# Patient Record
Sex: Male | Born: 1985
Health system: Southern US, Community
[De-identification: ages and names within clinical notes are randomized; demographics above are authoritative.]

## PROBLEM LIST (undated history)

## (undated) HISTORY — PX: HAND SURGERY: SHX662

---
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
## 2006-08-02 ENCOUNTER — Emergency Department (HOSPITAL_COMMUNITY): Admission: EM | Admit: 2006-08-02 | Discharge: 2006-08-02 | Payer: Self-pay | Admitting: Emergency Medicine

## 2006-10-10 ENCOUNTER — Emergency Department (HOSPITAL_COMMUNITY): Admission: EM | Admit: 2006-10-10 | Discharge: 2006-10-10 | Payer: Self-pay | Admitting: Emergency Medicine

## 2010-08-03 ENCOUNTER — Emergency Department (HOSPITAL_COMMUNITY): Admission: EM | Admit: 2010-08-03 | Discharge: 2010-08-03 | Payer: Self-pay | Admitting: Emergency Medicine

## 2010-09-28 ENCOUNTER — Emergency Department (HOSPITAL_COMMUNITY): Admission: EM | Admit: 2010-09-28 | Discharge: 2010-09-28 | Payer: Self-pay | Admitting: Emergency Medicine

## 2011-02-08 LAB — STREP A DNA PROBE: Group A Strep Probe: NEGATIVE

## 2012-03-09 ENCOUNTER — Encounter (HOSPITAL_COMMUNITY): Payer: Self-pay | Admitting: *Deleted

## 2012-03-09 ENCOUNTER — Emergency Department (HOSPITAL_COMMUNITY)
Admission: EM | Admit: 2012-03-09 | Discharge: 2012-03-09 | Disposition: A | Discharge status: Home / Self Care | Payer: Self-pay | Attending: Emergency Medicine | Admitting: Emergency Medicine

## 2012-03-09 DIAGNOSIS — J039 Acute tonsillitis, unspecified: Secondary | ICD-10-CM | POA: Insufficient documentation

## 2012-03-09 DIAGNOSIS — K089 Disorder of teeth and supporting structures, unspecified: Secondary | ICD-10-CM | POA: Insufficient documentation

## 2012-03-09 DIAGNOSIS — F17200 Nicotine dependence, unspecified, uncomplicated: Secondary | ICD-10-CM | POA: Insufficient documentation

## 2012-03-09 DIAGNOSIS — R509 Fever, unspecified: Secondary | ICD-10-CM | POA: Insufficient documentation

## 2012-03-09 DIAGNOSIS — K029 Dental caries, unspecified: Secondary | ICD-10-CM | POA: Insufficient documentation

## 2012-03-09 MED ORDER — PENICILLIN G BENZATHINE 1200000 UNIT/2ML IM SUSP
1.2000 10*6.[IU] | Freq: Once | INTRAMUSCULAR | Status: AC
  Administered 2012-03-09: 1.2 10*6.[IU] via INTRAMUSCULAR
  Filled 2012-03-09: qty 2

## 2012-03-09 MED ORDER — HYDROCODONE-ACETAMINOPHEN 7.5-500 MG/15ML PO SOLN: 15.0000 mL | Freq: Four times a day (QID) | ORAL | Status: AC | PRN

## 2012-03-09 MED ORDER — HYDROCODONE-ACETAMINOPHEN 7.5-500 MG/15ML PO SOLN
15.0000 mL | Freq: Once | ORAL | Status: AC
  Administered 2012-03-09: 15 mL via ORAL
  Filled 2012-03-09: qty 15

## 2012-03-09 MED ORDER — ACETAMINOPHEN 325 MG PO TABS
650.0000 mg | ORAL_TABLET | Freq: Once | ORAL | Status: AC
Start: 2012-03-09 — End: 2012-03-09
  Administered 2012-03-09: 650 mg via ORAL
  Filled 2012-03-09: qty 2

## 2012-03-09 NOTE — ED Provider Notes (Signed)
History     CSN: [id number]  Arrival date & time 03/09/12  0409   First MD Initiated Contact with Patient 03/09/12 [phone]      Chief Complaint  Patient presents with  . Dental Pain    (Consider location/radiation/quality/duration/timing/severity/associated sxs/prior treatment) Patient is a 26 y.o. male presenting with tooth pain. The history is provided by the patient.  Dental PainThe primary symptoms include fever and sore throat. Primary symptoms comment: He also complains of toothache, also starting last night. The symptoms began 6 to 12 hours ago. The symptoms are worsening. The symptoms occur constantly.  The sore throat began today. The sore throat is moderate in intensity. The sore throat is accompanied by hoarse voice. The sore throat is not accompanied by trouble swallowing.  Additional symptoms include: pain with swallowing. Additional symptoms do not include: trouble swallowing.    History reviewed. No pertinent past medical history.  History reviewed. No pertinent past surgical history.  No family history on file.  History  Substance Use Topics  . Smoking status: Current Everyday Smoker  . Smokeless tobacco: Not on file  . Alcohol Use: Yes      Review of Systems  Constitutional: Positive for fever. Negative for chills.  HENT: Positive for sore throat, hoarse voice and dental problem. Negative for trouble swallowing.   Respiratory: Negative.   Cardiovascular: Negative.   Gastrointestinal: Negative.   Musculoskeletal: Negative.   Skin: Negative.   Neurological: Negative.     Allergies  Review of patient's allergies indicates no known allergies.  Home Medications  No current outpatient prescriptions on file.  BP 123/62  Pulse 90  Temp(Src) 101.5 F (38.6 C) (Oral)  Resp 20  SpO2 97%  Physical Exam  Constitutional: He is oriented to person, place, and time. He appears well-developed and well-nourished.  HENT:  Head: No trismus in the jaw.  Right  Ear: Tympanic membrane normal.  Left Ear: Tympanic membrane normal.  Mouth/Throat: Mucous membranes are not dry. Dental caries present. Oropharyngeal exudate and posterior oropharyngeal erythema present. No tonsillar abscesses.  Neck: Normal range of motion. Neck supple.  Cardiovascular: Normal rate and regular rhythm.   No murmur heard. Pulmonary/Chest: Effort normal. He has no wheezes. He has no rales.  Musculoskeletal: Normal range of motion.  Lymphadenopathy:    He has cervical adenopathy.  Neurological: He is alert and oriented to person, place, and time.  Skin: Skin is warm and dry.  Psychiatric: He has a normal mood and affect.    ED Course  Procedures (including critical care time)  Labs Reviewed - No data to display No results found.   No diagnosis found.  1. Exudative tonsillitis 2. Dental caries  MDM  Tonsillar exudate with history of multiple episodes of strep. Will treat without swab - given Bicillin and pain medication.         Rodena Medin, PA-C 03/09/12 [phone]

## 2012-03-09 NOTE — ED Notes (Signed)
Explained to pt that provider should be around to see him soon.

## 2012-03-09 NOTE — Discharge Instructions (Signed)
RECOMMEND WARM, SALT WATER GARGLES FOR SORE THROAT PAIN. TAKE LORTAB ELIXIR FOR PAIN RELIEF AS WELL. PUSH FLUIDS. RETURN HERE WITH ANY WORSENING SYMPTOMS OR NEW CONCERNS.   Tonsillitis Tonsils are lumps of lymphoid tissues at the back of the throat. Each tonsil has 20 crevices (crypts). Tonsils help fight nose and throat infections and keep infection from spreading to other parts of the body for the first 18 months of life. Tonsillitis is an infection of the throat that causes the tonsils to become red, tender, and swollen. CAUSES Sudden and, if treated, temporary (acute) tonsillitis is usually caused by infection with streptococcal bacteria. Long lasting (chronic) tonsillitis occurs when the crypts of the tonsils become filled with pieces of food and bacteria, which makes it easy for the tonsils to become constantly infected. SYMPTOMS  Symptoms of tonsillitis include:  A sore throat.   White patches on the tonsils.   Fever.   Tiredness.  DIAGNOSIS Tonsillitis can be diagnosed through a physical exam. Diagnosis can be confirmed with the results of lab tests, including a throat culture. TREATMENT  The goals of tonsillitis treatment include the reduction of the severity and duration of symptoms, prevention of associated conditions, and prevention of disease transmission. Tonsillitis caused by bacteria can be treated with antibiotics. Usually, treatment with antibiotics is started before the cause of the tonsillitis is known. However, if it is determined that the cause is not bacterial, antibiotics will not treat the tonsillitis. If attacks of tonsillitis are severe and frequent, your caregiver may recommend surgery to remove the tonsils (tonsillectomy). HOME CARE INSTRUCTIONS   Rest as much as possible and get plenty of sleep.   Drink plenty of fluids. While the throat is very sore, eat soft foods or liquids, such as sherbet, soups, or instant breakfast drinks.   Eat frozen ice pops.    Older children and adults may gargle with a warm or cold liquid to help soothe the throat. Mix 1 teaspoon of salt in 1 cup of water.   Other family members who also develop a sore throat or fever should have a medical exam or throat culture.   Only take over-the-counter or prescription medicines for pain, discomfort, or fever as directed by your caregiver.   If you are given antibiotics, take them as directed. Finish them even if you start to feel better.  SEEK MEDICAL CARE IF:   Your baby is older than 3 months with a rectal temperature of 100.5 F (38.1 C) or higher for more than 1 day.   Large, tender lumps develop in your neck.   A rash develops.   Green, yellow-brown, or bloody substance is coughed up.   You are unable to swallow liquids or food for 24 hours.   Your child is unable to swallow food or liquids for 12 hours.  SEEK IMMEDIATE MEDICAL CARE IF:   You develop any new symptoms such as vomiting, severe headache, stiff neck, chest pain, or trouble breathing or swallowing.   You have severe throat pain along with drooling or voice changes.   You have severe pain, unrelieved with recommended medications.   You are unable to fully open the mouth.   You develop redness, swelling, or severe pain anywhere in the neck.   You have a fever.   Your baby is older than 3 months with a rectal temperature of 102 F (38.9 C) or higher.   Your baby is 46 months old or younger with a rectal temperature of 100.4 F (  38 C) or higher.  MAKE SURE YOU:   Understand these instructions.   Will watch your condition.   Will get help right away if you are not doing well or get worse.  Document Released: 08/22/2005 Document Revised: 11/01/2011 Document Reviewed: 01/18/2011 [facility] Patient Information 2012 [place], [place].

## 2012-03-09 NOTE — ED Notes (Signed)
The pt says he has a sorethroat or a toothache that started today

## 2012-03-09 NOTE — ED Provider Notes (Signed)
Medical screening examination/treatment/procedure(s) were performed by non-physician practitioner and as supervising physician I was immediately available for consultation/collaboration.  Olivia Mackie, MD 03/09/12 [phone]

## 2014-03-09 ENCOUNTER — Emergency Department (HOSPITAL_COMMUNITY)
Admission: EM | Admit: 2014-03-09 | Discharge: 2014-03-09 | Disposition: A | Discharge status: Home / Self Care | Payer: Self-pay | Attending: Emergency Medicine | Admitting: Emergency Medicine

## 2014-03-09 DIAGNOSIS — F17200 Nicotine dependence, unspecified, uncomplicated: Secondary | ICD-10-CM | POA: Insufficient documentation

## 2014-03-09 DIAGNOSIS — K029 Dental caries, unspecified: Secondary | ICD-10-CM | POA: Insufficient documentation

## 2014-03-09 MED ORDER — OXYCODONE-ACETAMINOPHEN 5-325 MG PO TABS: 1.0000 | ORAL_TABLET | Freq: Once | ORAL | Status: DC

## 2014-03-09 MED ORDER — IBUPROFEN 600 MG PO TABS: 600.0000 mg | ORAL_TABLET | Freq: Four times a day (QID) | ORAL | Status: DC | PRN

## 2014-03-09 MED ORDER — OXYCODONE-ACETAMINOPHEN 5-325 MG PO TABS
1.0000 | ORAL_TABLET | Freq: Once | ORAL | Status: AC
  Administered 2014-03-09: 1 via ORAL
  Filled 2014-03-09: qty 1

## 2014-03-09 MED ORDER — PENICILLIN V POTASSIUM 500 MG PO TABS: 500.0000 mg | ORAL_TABLET | Freq: Four times a day (QID) | ORAL | Status: DC

## 2014-03-09 MED ORDER — OXYCODONE-ACETAMINOPHEN 5-325 MG PO TABS: 1.0000 | ORAL_TABLET | Freq: Four times a day (QID) | ORAL | Status: DC | PRN

## 2014-03-09 NOTE — Progress Notes (Signed)
   CARE MANAGEMENT ED NOTE 03/09/2014  Patient:  Joshua Small, Joshua Small   Account Number:  [id number]  Date Initiated:  03/09/2014  Documentation initiated by:  Livia Snellen  Subjective/Objective Assessment:   Patient presents to Ed with dental pain.     Subjective/Objective Assessment Detail:     Action/Plan:   Action/Plan Detail:   Anticipated DC Date:  03/09/2014     Status Recommendation to Physician:   Result of Recommendation:    Other ED [place]  Other  PCP issues    Choice offered to / List presented to:            Status of service:  Completed, signed off  ED Comments:   ED Comments Detail:  EDCM spoke to patient at bedside.  As per patient, he does not have a pcp or insurance living in [place] [employer] Patient to be discharged on pen v k, which is free at [facility], [facility] informed patient of this.  [facility] provided patient with a list of pcps who accept self pay patients, list of discounted pharmcies and websites needymeds.org and Good https://www.henry-hernandez.biz/ for medication assistance, phone number to inquire bout the orange card, Medicaid and Affordable Care Act for insurance, list of financial resources in the community such as local churches and [employer], and dental assistance for uninsured patients.  Also provided patient with RX discount card. Patient thankful for resources.  No further EDCM need at this time.

## 2014-03-09 NOTE — ED Provider Notes (Signed)
CSN: [id number]     Arrival date & time 03/09/14  1710 History  This chart was scribed for non-physician practitioner .working with Ephraim Hamburger, MD by Mercy Moore, ED Scribe. This patient was seen in room WTR8/WTR8 and the patient's care was started at 6:22 PM.   Chief Complaint  Patient presents with  . Dental Pain      The history is provided by the patient. No language interpreter was used.   HPI Comments: Joshua Small is a 28 y.o. male who presents to the Emergency Department complaining of lower right sided dental pain after breaking a tooth while eating hard candy earlier today. Patient admits to having problems with the tooth previously. He says there was already a caries there that has grown in size as result of his breaking it. Patient suspects that the tooth may be infected, but is uncertain. Patient has treated his dental pain with Tylenol which is not helping. Pain is constant, worsened with chewing. No swelling under the tongue. No difficulty opening mouth. Patient does not have dentist.  No past medical history on file. No past surgical history on file. No family history on file. History  Substance Use Topics  . Smoking status: Current Every Day Smoker  . Smokeless tobacco: Not on file  . Alcohol Use: Yes    Review of Systems  Constitutional: Negative for fever.  HENT: Positive for dental problem.       Allergies  Review of patient's allergies indicates no known allergies.  Home Medications   Prior to Admission medications   Not on File   Triage VtBP 127/80  Pulse 59  Temp(Src) 98.5 F (36.9 C) (Oral)  Resp 16  SpO2 99% Physical Exam  Nursing note and vitals reviewed. Constitutional: He is oriented to person, place, and time. He appears well-developed and well-nourished. No distress.  HENT:  Head: Normocephalic and atraumatic.  Mouth/Throat: Dental caries present. No dental abscesses.  Large carries in right lower 1st molar. No surrounding  gum swelling, no erythema. Tooth tender to palpation. No trismus. No swelling under the tongue. No facial sweklling.   Eyes: EOM are normal.  Neck: Neck supple. No tracheal deviation present.  Cardiovascular: Normal rate.   Pulmonary/Chest: Effort normal. No respiratory distress.  Musculoskeletal: Normal range of motion.  Neurological: He is alert and oriented to person, place, and time.  Skin: Skin is warm and dry.  Psychiatric: He has a normal mood and affect. His behavior is normal.    ED Course  Procedures (including critical care time) DIAGNOSTIC STUDIES: Oxygen Saturation is 99% on room air, normal by my interpretation.    COORDINATION OF CARE: 6:25 PM- Will order pain medication, an antibiotic and refer to on-call dentist. Discussed treatment plan with patient at bedside and patient agreed to plan.     Labs Review Labs Reviewed - No data to display  Imaging Review No results found.   EKG Interpretation None      MDM   Final diagnoses:  Dental cavity    Patient with a large dental caries, no signs of an abscess or infection at this time. No evidence of Ludwig's angina, no swelling under the tongue, no trismus. Patient does not have a dentist. Will start on penicillin, ibuprofen and Percocet for pain. Will refer to a dentist and given a flyer for free dental clinic at this time. Patient instructed to followup.  Filed Vitals:   03/09/14 1721 03/09/14 1742  BP: 126/70 127/80  Pulse: 60  59  Temp: 98.1 F (36.7 C) 98.5 F (36.9 C)  TempSrc: Oral Oral  Resp: 16 16  SpO2: 97% 99%   I personally performed the services described in this documentation, which was scribed in my presence. The recorded information has been reviewed and is accurate.   Renold Genta, PA-C 03/09/14 1945

## 2014-03-09 NOTE — ED Notes (Signed)
Pt has a ride home.  

## 2014-03-09 NOTE — ED Notes (Signed)
Pt states a tooth broke off in his bottom molar area in the R side of his mouth. Pt states it happened today. Pt does not have a dentist. Pt alert, no acute distress.

## 2014-03-09 NOTE — Discharge Instructions (Signed)
Ibuprofen for pain. Oxycodone for severe pain. Penicillin for infection. Follow up with a dentist.    Dental Caries  Dental caries (also called tooth decay) is the most common oral disease. It can occur at any age, but is more common in children and young adults.  HOW DENTAL CARIES DEVELOPS  The process of decay begins when bacteria and foods (particularly sugars and starches) combine in your mouth to produce plaque. Plaque is a substance that sticks to the hard, outer surface of a tooth (enamel). The bacteria in plaque produce acids that attack enamel. These acids may also attack the root surface of a tooth (cementum) if it is exposed. Repeated attacks dissolve these surfaces and create holes in the tooth (cavities). If left untreated, the acids destroy the other layers of the tooth.  RISK FACTORS  Frequent sipping of sugary beverages.   Frequent snacking on sugary and starchy foods, especially those that easily get stuck in the teeth.   Poor oral hygiene.   Dry mouth.   Substance abuse such as methamphetamine abuse.   Broken or poor-fitting dental restorations.   Eating disorders.   Gastroesophageal reflux disease (GERD).   Certain radiation treatments to the head and neck. SYMPTOMS In the early stages of dental caries, symptoms are seldom present. Sometimes white, chalky areas may be seen on the enamel or other tooth layers. In later stages, symptoms may include:  Pits and holes on the enamel.  Toothache after sweet, hot, or cold foods or drinks are consumed.  Pain around the tooth.  Swelling around the tooth. DIAGNOSIS  Most of the time, dental caries is detected during a regular dental checkup. A diagnosis is made after a thorough medical and dental history is taken and the surfaces of your teeth are checked for signs of dental caries. Sometimes special instruments, such as lasers, are used to check for dental caries. Dental X-ray exams may be taken so that areas not  visible to the eye (such as between the contact areas of the teeth) can be checked for cavities.  TREATMENT  If dental caries is in its early stages, it may be reversed with a fluoride treatment or an application of a remineralizing agent at the dental office. Thorough brushing and flossing at home is needed to aid these treatments. If it is in its later stages, treatment depends on the location and extent of tooth destruction:   If a small area of the tooth has been destroyed, the destroyed area will be removed and cavities will be filled with a material such as gold, silver amalgam, or composite resin.   If a large area of the tooth has been destroyed, the destroyed area will be removed and a cap (crown) will be fitted over the remaining tooth structure.   If the center part of the tooth (pulp) is affected, a procedure called a root canal will be needed before a filling or crown can be placed.   If most of the tooth has been destroyed, the tooth may need to be pulled (extracted). HOME CARE INSTRUCTIONS You can prevent, stop, or reverse dental caries at home by practicing good oral hygiene. Good oral hygiene includes:  Thoroughly cleaning your teeth at least twice a day with a toothbrush and dental floss.   Using a fluoride toothpaste. A fluoride mouth rinse may also be used if recommended by your dentist or health care provider.   Restricting the amount of sugary and starchy foods and sugary liquids you consume.  Avoiding frequent snacking on these foods and sipping of these liquids.   Keeping regular visits with a dentist for checkups and cleanings. PREVENTION   Practice good oral hygiene.  Consider a dental sealant. A dental sealant is a coating material that is applied by your dentist to the pits and grooves of teeth. The sealant prevents food from being trapped in them. It may protect the teeth for several years.  Ask about fluoride supplements if you live in a community  without fluorinated water or with water that has a low fluoride content. Use fluoride supplements as directed by your dentist or health care provider.  Allow fluoride varnish applications to teeth if directed by your dentist or health care provider. Document Released: 08/04/2002 Document Revised: 07/15/2013 Document Reviewed: 11/14/2012 [facility] Patient Information 2014 [place].

## 2014-03-11 NOTE — ED Provider Notes (Signed)
Medical screening examination/treatment/procedure(s) were performed by non-physician practitioner and as supervising physician I was immediately available for consultation/collaboration.   EKG Interpretation None        Elany Felix T Nanette Wirsing, MD 03/11/14 1549 

## 2014-07-13 ENCOUNTER — Emergency Department (HOSPITAL_COMMUNITY)
Admission: EM | Admit: 2014-07-13 | Discharge: 2014-07-13 | Disposition: A | Discharge status: Home / Self Care | Payer: Self-pay | Attending: Emergency Medicine | Admitting: Emergency Medicine

## 2014-07-13 ENCOUNTER — Emergency Department (HOSPITAL_COMMUNITY): Payer: Self-pay

## 2014-07-13 ENCOUNTER — Encounter (HOSPITAL_COMMUNITY): Payer: Self-pay | Admitting: Emergency Medicine

## 2014-07-13 DIAGNOSIS — F17200 Nicotine dependence, unspecified, uncomplicated: Secondary | ICD-10-CM | POA: Insufficient documentation

## 2014-07-13 DIAGNOSIS — Z79899 Other long term (current) drug therapy: Secondary | ICD-10-CM | POA: Insufficient documentation

## 2014-07-13 DIAGNOSIS — J029 Acute pharyngitis, unspecified: Secondary | ICD-10-CM | POA: Insufficient documentation

## 2014-07-13 DIAGNOSIS — J069 Acute upper respiratory infection, unspecified: Secondary | ICD-10-CM | POA: Insufficient documentation

## 2014-07-13 LAB — RAPID STREP SCREEN (MED CTR MEBANE ONLY): Streptococcus, Group A Screen (Direct): NEGATIVE

## 2014-07-13 MED ORDER — ACETAMINOPHEN-CODEINE 120-12 MG/5ML PO SOLN: 10.0000 mL | ORAL | Status: DC | PRN

## 2014-07-13 MED ORDER — PREDNISONE 50 MG PO TABS: 50.0000 mg | ORAL_TABLET | Freq: Every day | ORAL | Status: DC

## 2014-07-13 MED ORDER — GUAIFENESIN ER 1200 MG PO TB12: 1.0000 | ORAL_TABLET | Freq: Two times a day (BID) | ORAL | Status: DC

## 2014-07-13 NOTE — ED Notes (Signed)
Pt c/o sore throat and congestion x 1 day. Pt has productive cough of clear sputum.

## 2014-07-13 NOTE — ED Notes (Signed)
Patient is alert and oriented x3.  He was given DC instructions and follow up visit instructions.  Patient gave verbal understanding.  He was DC ambulatory under his own power to home.  V/S stable.  He was not showing any signs of distress on DC 

## 2014-07-13 NOTE — Discharge Instructions (Signed)
Return here as needed. Follow up with a primary doctor or urgent care. Increase your fluid intake. Rest as much as possible. Your strep screen was negative and your x-rays were normal

## 2014-07-13 NOTE — ED Provider Notes (Signed)
CSN: [id number]     Arrival date & time 07/13/14  1843 History   None    This chart was scribed for non-physician practitioner, Irena Cords PA-C working with Evelina Bucy, MD by Forrestine Him, ED Scribe. This patient was seen in room WTR6/WTR6 and the patient's care was started at 8:08 PM.   Chief Complaint  Patient presents with  . Sore Throat  . congestion    The history is provided by the patient. No language interpreter was used.    HPI Comments: Joshua Small is a 28 y.o. male who presents to the Emergency Department complaining of a constant, moderate sore throat x 1 day that is unchanged at this time. Pt also reports congestion and a productive cough consisting of clear mucous. He has not tried any OTC medications or any home remedies to help manage symptoms. At this time he denies any fever, chills, CP, rhinorrhea, or neck pain. Pt is an every day smoker and smokes about 1 pack of cigarettes a day. Pt is unaware of any known sick contacts.  History reviewed. No pertinent past medical history. History reviewed. No pertinent past surgical history. No family history on file. History  Substance Use Topics  . Smoking status: Current Every Day Smoker  . Smokeless tobacco: Not on file  . Alcohol Use: Yes    Review of Systems  Constitutional: Negative for fever and chills.  HENT: Positive for congestion and sore throat. Negative for rhinorrhea.   Respiratory: Positive for cough.   Psychiatric/Behavioral: Negative for confusion.      Allergies  Review of patient's allergies indicates no known allergies.  Home Medications   Prior to Admission medications   Medication Sig Start Date End Date Taking? Authorizing Provider  ibuprofen (ADVIL,MOTRIN) 600 MG tablet Take 1 tablet (600 mg total) by mouth every 6 (six) hours as needed. 03/09/14   Tatyana A Kirichenko, PA-C  oxyCODONE-acetaminophen (PERCOCET/ROXICET) 5-325 MG per tablet Take 1 tablet by mouth every 6 (six) hours as  needed for severe pain. 03/09/14   Tatyana A Kirichenko, PA-C  penicillin v potassium (VEETID) 500 MG tablet Take 1 tablet (500 mg total) by mouth 4 (four) times daily. 03/09/14   Tatyana A Kirichenko, PA-C   Triage Vitals: BP 138/69  Pulse 62  Temp(Src) 98.9 F (37.2 C) (Oral)  Resp 20  SpO2 97%   Physical Exam  Nursing note and vitals reviewed. Constitutional: He is oriented to person, place, and time. He appears well-developed and well-nourished.  HENT:  Head: Normocephalic and atraumatic.  Mouth/Throat: Oropharynx is clear and moist.  Eyes: Pupils are equal, round, and reactive to light.  Neck: Normal range of motion. Neck supple.  Cardiovascular: Normal rate, regular rhythm and normal heart sounds.  Exam reveals no gallop and no friction rub.   No murmur heard. Pulmonary/Chest: Effort normal and breath sounds normal. No respiratory distress. He has no wheezes.  Lymphadenopathy:    He has cervical adenopathy.  Neurological: He is alert and oriented to person, place, and time.  Skin: Skin is warm and dry. No rash noted. No erythema.  Psychiatric: He has a normal mood and affect.    ED Course  Procedures (including critical care time)  DIAGNOSTIC STUDIES: Oxygen Saturation is 97% on RA, Normal by my interpretation.    COORDINATION OF CARE: 8:07 PM- Will order DG chest 2 view and rapid strep screen. Discussed treatment plan with pt at bedside and pt agreed to plan.     Labs Review  Labs Reviewed  RAPID STREP SCREEN  CULTURE, GROUP A STREP   I personally performed the services described in this documentation, which was scribed in my presence. The recorded information has been reviewed and is accurate.    Brent General, PA-C 07/13/14 2051

## 2014-07-14 NOTE — ED Provider Notes (Signed)
Medical screening examination/treatment/procedure(s) were performed by non-physician practitioner and as supervising physician I was immediately available for consultation/collaboration.   EKG Interpretation None        Evelina Bucy, MD 07/14/14 0003

## 2014-07-15 LAB — CULTURE, GROUP A STREP

## 2014-09-16 ENCOUNTER — Encounter (HOSPITAL_COMMUNITY): Payer: Self-pay | Admitting: Emergency Medicine

## 2014-09-16 ENCOUNTER — Emergency Department (HOSPITAL_COMMUNITY)
Admission: EM | Admit: 2014-09-16 | Discharge: 2014-09-17 | Disposition: A | Discharge status: Home / Self Care | Payer: Self-pay | Attending: Emergency Medicine | Admitting: Emergency Medicine

## 2014-09-16 DIAGNOSIS — K088 Other specified disorders of teeth and supporting structures: Secondary | ICD-10-CM | POA: Insufficient documentation

## 2014-09-16 DIAGNOSIS — K0889 Other specified disorders of teeth and supporting structures: Secondary | ICD-10-CM

## 2014-09-16 DIAGNOSIS — K029 Dental caries, unspecified: Secondary | ICD-10-CM | POA: Insufficient documentation

## 2014-09-16 DIAGNOSIS — Z720 Tobacco use: Secondary | ICD-10-CM | POA: Insufficient documentation

## 2014-09-16 NOTE — ED Notes (Signed)
Pt is c/o toothache on the bottom right  Sxs started a day ago

## 2014-09-17 MED ORDER — OXYCODONE-ACETAMINOPHEN 5-325 MG PO TABS: 1.0000 | ORAL_TABLET | Freq: Three times a day (TID) | ORAL | Status: DC | PRN

## 2014-09-17 MED ORDER — PENICILLIN V POTASSIUM 250 MG PO TABS: 500.0000 mg | ORAL_TABLET | Freq: Four times a day (QID) | ORAL | Status: AC

## 2014-09-17 MED ORDER — IBUPROFEN 400 MG PO TABS: 400.0000 mg | ORAL_TABLET | Freq: Four times a day (QID) | ORAL | Status: DC | PRN

## 2014-09-17 MED ORDER — OXYCODONE-ACETAMINOPHEN 5-325 MG PO TABS
1.0000 | ORAL_TABLET | Freq: Once | ORAL | Status: AC
  Administered 2014-09-17: 1 via ORAL
  Filled 2014-09-17: qty 1

## 2014-09-17 NOTE — ED Provider Notes (Signed)
Medical screening examination/treatment/procedure(s) were performed by non-physician practitioner and as supervising physician I was immediately available for consultation/collaboration.   Delora Fuel, MD [phone]

## 2014-09-17 NOTE — Discharge Instructions (Signed)
Please call your doctor for a followup appointment within 24-48 hours. When you talk to your doctor please let them know that you were seen in the emergency department and have them acquire all of your records so that they can discuss the findings with you and formulate a treatment plan to fully care for your new and ongoing problems. Please call and set-up an appointment with a dentist Please rest and stay hydrated Please drink plenty of water Please take medications on a full stomach  Please take medications as prescribed - while on pain medications there is to be no drinking alcohol, driving, operating any heavy machinery. If extra please dispose in a proper manner. Please do not take any extra Tylenol with this medication for this can lead to Tylenol overdose and liver issues.  Please continue to monitor symptoms closely and if symptoms are to worsen or change (fever greater than 101, chills, sweating, nausea, vomiting, chest pain, shortness of breathe, difficulty breathing, weakness, numbness, tingling, worsening or changes to pain pattern, swelling to the face, drainage, bleeding, inability to open and close the mouth) please report back to the Emergency Department immediately.    Dental Pain A tooth ache may be caused by cavities (tooth decay). Cavities expose the nerve of the tooth to air and hot or cold temperatures. It may come from an infection or abscess (also called a boil or furuncle) around your tooth. It is also often caused by dental caries (tooth decay). This causes the pain you are having. DIAGNOSIS  Your caregiver can diagnose this problem by exam. TREATMENT   If caused by an infection, it may be treated with medications which kill germs (antibiotics) and pain medications as prescribed by your caregiver. Take medications as directed.  Only take over-the-counter or prescription medicines for pain, discomfort, or fever as directed by your caregiver.  Whether the tooth ache today  is caused by infection or dental disease, you should see your dentist as soon as possible for further care. SEEK MEDICAL CARE IF: The exam and treatment you received today has been provided on an emergency basis only. This is not a substitute for complete medical or dental care. If your problem worsens or new problems (symptoms) appear, and you are unable to meet with your dentist, call or return to this location. SEEK IMMEDIATE MEDICAL CARE IF:   You have a fever.  You develop redness and swelling of your face, jaw, or neck.  You are unable to open your mouth.  You have severe pain uncontrolled by pain medicine. MAKE SURE YOU:   Understand these instructions.  Will watch your condition.  Will get help right away if you are not doing well or get worse. Document Released: 11/12/2005 Document Revised: 02/04/2012 Document Reviewed: 06/30/2008 [facility] Patient Information 2015 [place], [place]. This information is not intended to replace advice given to you by your health care provider. Make sure you discuss any questions you have with your health care provider.  Dental Caries Dental caries is tooth decay. This decay can cause a hole in teeth (cavity) that can get bigger and deeper over time. HOME CARE  Brush and floss your teeth. Do this at least two times a day.  Use a fluoride toothpaste.  Use a mouth rinse if told by your dentist or doctor.  Eat less sugary and starchy foods. Drink less sugary drinks.  Avoid snacking often on sugary and starchy foods. Avoid sipping often on sugary drinks.  Keep regular checkups and cleanings with  your dentist.  Use fluoride supplements if told by your dentist or doctor.  Allow fluoride to be applied to teeth if told by your dentist or doctor. Document Released: 08/21/2008 Document Revised: 03/29/2014 Document Reviewed: 11/14/2012 [facility] Patient Information 2015 [place], [place]. This information is not intended to replace advice given to you  by your health care provider. Make sure you discuss any questions you have with your health care provider.   Emergency Department Resource Guide 1) Find a Doctor and Pay Out of Pocket Although you won't have to find out who is covered by your insurance plan, it is a good idea to ask around and get recommendations. You will then need to call the office and see if the doctor you have chosen will accept you as a new patient and what types of options they offer for patients who are self-pay. Some doctors offer discounts or will set up payment plans for their patients who do not have insurance, but you will need to ask so you aren't surprised when you get to your appointment.  2) Contact Your Local Health Department Not all health departments have doctors that can see patients for sick visits, but many do, so it is worth a call to see if yours does. If you don't know where your local health department is, you can check in your phone book. The CDC also has a tool to help you locate your state's health department, and many state websites also have listings of all of their local health departments.  3) Find a [facility] If your illness is not likely to be very severe or complicated, you may want to try a walk in clinic. These are popping up all over the country in pharmacies, drugstores, and shopping centers. They're usually staffed by nurse practitioners or physician assistants that have been trained to treat common illnesses and complaints. They're usually fairly quick and inexpensive. However, if you have serious medical issues or chronic medical problems, these are probably not your best option.  No Primary Care Doctor: - Call Health Connect at  [phone] - they can help you locate a primary care doctor that  accepts your insurance, provides certain services, etc. - Physician Referral Service- [phone]  Chronic Pain Problems: Organization         Address  Phone   Notes  [facility]  [phone] Patients need to be referred by their primary care doctor.   Medication Assistance: Organization         Address  Phone   Notes  [facility] Medication Day [facility] [place], [place] [address] [phone] --Must be a resident of [facility] -- Must have NO insurance coverage whatsoever (no Medicaid/ Medicare, etc.) -- The pt. MUST have a primary care doctor that directs their care regularly and follows them in the community   [facility]ist  [phone]   [employer]  [phone]    Agencies that provide inexpensive medical care: Organization         Address  Phone   Notes  [place]  [phone]   [facility]    [phone]   [facility] [place], [place] [address] [phone]   Rosalie [address], [place] [phone]   Planned Parenthood    [phone]   [facility]    [phone]   Community  Health and [place]  [address], [place] Phone:  [phone], Fax:  [phone] Hours of Operation:  9 am - 6 pm, M-F.  Also accepts Medicaid/Medicare and self-pay.  [facility] for [place], [address], [place] Phone: [phone], Fax: [phone]. Hours of Operation:  8:30 am - 5:30 pm, M-F.  Also accepts Medicaid and self-pay.  [facility] [address] [place] Phone: [phone]   [place], [place], [place] [phone], Ext. [phone] Mondays & Thursdays: 7-9 AM.  First 15 patients are seen on a first come, first serve basis.    [place] Providers:  Organization         Address  Phone   Notes  [facility] [address], [place] [phone] Also accepts self-pay patients.  [facility] [address], [place]  [phone]   [place], [address], [place] [phone]   [facility] [address] [place] [phone]   Lucianne Lei [address], [place]   [phone] Only accepts [place] Access [place] patients after they have their name applied to their card.   Self-Pay (no insurance) in [facility]:  Organization         Address  Phone   Notes  Sickle Cell Patients, [facility] [place] [phone]   [facility] [place] [phone]   [facility] [place]  [place], [place], [place] [phone]   [facility]/Dr. Osei-Bonsu  [address], [place] or [address], [place] [phone] Phone number for both [place] and [place] locations is the same.  [facility] [address], [place] [phone]   [facility] [address], [place] or [address] [phone]   [facility] [address] [place] [phone], phone; [phone], fax Sees patients 1st and 3rd Saturday of every month.  Must not qualify for public or private insurance (i.e. Medicaid, Medicare, [place] Health Choice, Veterans' Benefits)  Household income should be no more than 200% of the poverty level The clinic cannot treat you if you are pregnant or think you are pregnant  Sexually transmitted diseases are not treated at the clinic.    Dental Care: Organization         Address  Phone  Notes  [facility] Department of [facility] [phone] Accepts children up to age 13 who are enrolled in [place] or [place]; pregnant women with a Medicaid card; and children who have applied for Medicaid or [place]  Health Choice, but were declined, whose parents can pay a reduced fee at time of service.  [facility] Department of [facility]  [address], [place] [phone] Accepts children up to age 61 who are enrolled in [place] or [place]; pregnant women with a Medicaid card; and children who have applied for Medicaid or [place] Health Choice, but were declined, whose parents can pay a reduced fee at time of service.  [place] [facility] Access PRO[facility]  [phone]  [place], [place] [phone] Patients are seen by appointment only. Walk-ins are not accepted. [place] will see patients 55 years of age and older. Monday - Tuesday (8am-5pm) Most Wednesdays (8:30-5pm) $30 per visit, cash only  [facility] Adult Dental Access PROGRAM  [address], [facility] [phone] Patients are seen by appointment only. Walk-ins are not accepted. [place] will see patients 77 years of age and older. One Wednesday Evening (Monthly: Volunteer Based).  $30 per visit, cash only  [place]  [phone] for adults; Children under age 30, call Graduate Pediatric Dentistry at [phone]. Children aged 44-14, please call [phone] to request a pediatric application.  Dental services are provided in all areas of dental care including fillings, crowns and bridges, complete and partial dentures, implants, gum treatment, root canals, and extractions. Preventive care is also provided. Treatment is provided to both adults and children. Patients are selected via a lottery and there is often a waiting list.   [facility] [address] [place]  [phone] [email or web address]   [facility] [address] [place], [place] [phone], Ext. [phone] Second and Fourth Thursday of each month, opens at 6:30 AM; Clinic ends at 9 AM.  Patients are seen on a first-come first-served basis, and a limited number are seen during  each clinic.   [facility]  [address] Hillard Danker [place], [place] [phone]   Eligibility Requirements You must have lived in [place], [place], or [place] counties for at least the last three months.   You cannot be eligible for state or federal sponsored [employer], including [employer], [place], or [employer].   You generally cannot be eligible for healthcare insurance through your employer.    How to apply: Eligibility screenings are held every Tuesday and Wednesday afternoon from 1:00 pm until 4:00 pm. You do not need an appointment for the interview!  [facility] [address], [place], [place]   [place]  [place] Department  [place]  [phone]    Behavioral Health Resources in the Community: Intensive Outpatient Programs Organization         Address  Phone  Notes  [place]. [address] [place], [place] [phone]   [facility] Outpatient [address], [place], [place]   ADS: Alcohol & Drug Svcs [address] [place], [place]   [place] [address],  [place], [place] or [phone]   Substance Abuse Resources Organization         Address  Phone  Notes  Alcohol and Drug Services  [phone]   [place]  [phone]   [facility] [place]   Chinita Pester  [phone]   Residential & Outpatient Substance Abuse Program  [phone]   Psychological Services Organization         Address  Phone  Notes  [facility]  [place]  [phone]   [place] [address] [place] or [phone]    Mobile Crisis Teams Organization         Address  Phone  Notes  [facility]atives, Mobile  Crisis Care Unit  [phone]   Assertive Psychotherapeutic Services  [address] [place], [place]   Ivin Booty  [place] [phone]    Self-Help/Support Groups Organization         Address  Phone             Notes  [facility] [place] - variety of support groups  [place] Call for more information  Narcotics Anonymous (NA), [facility]aring [facility] [address], [employer] [place]  2 meetings at this location   Special educational needs teacher         Address  Phone  Notes  [facility] [place],    [place]  [phone]   [facility]  [address], [place] [phone], [place], [place]   [place], [place] [phone] Admissions: 8am-3pm M-F  Incentives Substance [place] [address],    [place], [place] [phone]   [facility] [address] [place], [place], [place]   [facility] [address]  [place], [place]   [facility] Programs - Intensive Outpatient [address] Kristeen [address], [place], [place]   [facility] ([place].) [employer].,  [place], [place] [phone] or [phone]   [facility]idential Treatment Services (RTS) [address], [place], [place] Accepts Medicaid  [facility] [place] [address],  [place] [phone] Substance Abuse/Addiction Treatment   [facility] Organization         Address  Phone  Notes  [facility]  [phone]   Domenic Schwab, PhD [address] Arlis Porta [place], [place]   [phone] or [phone]   [place], [place] [phone]   [facility] [phone] [place], [place] [phone] Insurance/Medicaid/sponsorship through [facility] and Families [address], [address]  [place]                                    [place], [place] [phone] [place] [address], [place] [phone]    Dr. Adele Schilder  [phone]   [facility] Dept. 1) [address] [place] 2) [place] 3)  [address], [place] [phone]  [phone]   [place] [phone] or [phone] (After Hours)

## 2014-09-17 NOTE — ED Provider Notes (Signed)
CSN: [id number]     Arrival date & time 09/16/14  2342 History   First MD Initiated Contact with Patient 09/17/14 0015     Chief Complaint  Patient presents with  . Dental Pain     (Consider location/radiation/quality/duration/timing/severity/associated sxs/prior Treatment) The history is provided by the patient. No language interpreter was used.  Joshua Small is a 28 y/o M with no known significant PMhx presenting to the ED with dental pain that started last night localized to the right mandibular region described as a sharp shooting pain to the right side of the face that is constant. Patient reported that he has been using Tylenol extra strength with minimal relief. Stated that he believes he chipped his tooth. Patient reported that the pain gets worse with chewing and cold temperatures. Stated that he cannot recall the last time he was seen by a dentist. Denied fever, chills, neck pain, neck stiffness, chest pain, shortness of breath, difficulty breathing, throat closing sensation, drainage, bleeding, trismus, inability to open close the jaw line, swelling, blurred vision, sudden loss of vision, headache, dizziness. PCP none  History reviewed. No pertinent past medical history. Past Surgical History  Procedure Laterality Date  . Hand surgery     History reviewed. No pertinent family history. History  Substance Use Topics  . Smoking status: Current Every Day Smoker  . Smokeless tobacco: Not on file  . Alcohol Use: No    Review of Systems  Constitutional: Negative for fever and chills.  HENT: Positive for dental problem. Negative for trouble swallowing.   Respiratory: Negative for chest tightness and shortness of breath.   Cardiovascular: Negative for chest pain.  Musculoskeletal: Negative for neck pain and neck stiffness.      Allergies  Review of patient's allergies indicates no known allergies.  Home Medications   Prior to Admission medications   Medication Sig  Start Date End Date Taking? Authorizing Provider  acetaminophen (TYLENOL) 500 MG tablet Take 1,000 mg by mouth every 6 (six) hours as needed (toothache).   Yes Historical Provider, MD  ibuprofen (ADVIL,MOTRIN) 400 MG tablet Take 1 tablet (400 mg total) by mouth every 6 (six) hours as needed. 09/17/14   Jurline Folger, PA-C  oxyCODONE-acetaminophen (PERCOCET/ROXICET) 5-325 MG per tablet Take 1 tablet by mouth every 8 (eight) hours as needed for moderate pain or severe pain. 09/17/14   Nasreen Goedecke, PA-C  penicillin v potassium (VEETID) 250 MG tablet Take 2 tablets (500 mg total) by mouth 4 (four) times daily. 09/17/14 09/24/14  Tashiba Timoney, PA-C   BP 126/68  Pulse 82  Temp(Src) 97.8 F (36.6 C) (Oral)  Resp 18  SpO2 100% Physical Exam  Nursing note and vitals reviewed. Constitutional: He is oriented to person, place, and time. He appears well-developed and well-nourished. No distress.  HENT:  Head: Normocephalic and atraumatic.  Mouth/Throat: Oropharynx is clear and moist and mucous membranes are normal. No trismus in the jaw. Normal dentition. Dental caries present. No dental abscesses or uvula swelling.    Negative facial swelling identified. Right mandibular first molar noted to be diagrammed with negative swelling, erythema, inflammation, bleeding, drainage. Negative trismus. Negative sublingual lesions. Uvula midline with symmetrical elevation with negative swelling.   Eyes: Conjunctivae and EOM are normal. Pupils are equal, round, and reactive to light. Right eye exhibits no discharge. Left eye exhibits no discharge.  Neck: Normal range of motion. Neck supple. No tracheal deviation present.  Negative neck stiffness Negative nuchal rigidity Negative cervical lymphadenopathy Negative meningeal signs  Cardiovascular: Normal rate, regular rhythm and normal heart sounds.  Exam reveals no friction rub.   No murmur heard. Pulses:      Radial pulses are 2+ on the right side, and 2+  on the left side.  Pulmonary/Chest: Effort normal and breath sounds normal. No respiratory distress. He has no wheezes. He has no rales.  Patient is able to speak in full sentences without difficulty Negative use of accessory muscles Negative stridor  Musculoskeletal: Normal range of motion.  Lymphadenopathy:    He has no cervical adenopathy.  Neurological: He is alert and oriented to person, place, and time. No cranial nerve deficit. He exhibits normal muscle tone. Coordination normal.  Cranial nerves III-XII grossly intact  Skin: Skin is warm and dry. No rash noted. He is not diaphoretic. No erythema.  Psychiatric: He has a normal mood and affect. His behavior is normal. Thought content normal.    ED Course  Procedures (including critical care time) Labs Review Labs Reviewed - No data to display  Imaging Review No results found.   EKG Interpretation None      MDM   Final diagnoses:  Pain, dental  Dental caries    Medications  oxyCODONE-acetaminophen (PERCOCET/ROXICET) 5-325 MG per tablet 1 tablet (1 tablet Oral Given 09/17/14 0120)   Filed Vitals:   09/16/14 2350  BP: 126/68  Pulse: 82  Temp: 97.8 F (36.6 C)  TempSrc: Oral  Resp: 18  SpO2: 100%   Patient presenting to the ED with dental caries, chipped right mandibular first molar with negative active drainage or bleeding. Negative drainable abscess noted on exam. Doubt peritonsillar abscess. Doubt retropharyngeal abscess. Doubt Ludwig's angina. Patient given pain medications in ED setting-friend at bedside to drive patient. Patient stable, afebrile. Patient not septic appearing. Discharged patient. Discharged patient with antibiotics and pain medications-discussed course, precautions, disposal technique. Referred patient to health and wellness Center and dentist. Discussed with patient to rest and stay hydrated. Discussed with patient to closely monitor symptoms and if symptoms are to worsen or change to report back  to the ED - strict return instructions given.  Patient agreed to plan of care, understood, all questions answered.   [employer], PA-C 09/17/14 0140

## 2015-10-24 IMAGING — CR DG CHEST 2V
2 series · 2 of 2 positions shown · non-contrast
Comparison: None available

CLINICAL DATA: cough

EXAM:
CHEST - 2 VIEW

[w chest pa]
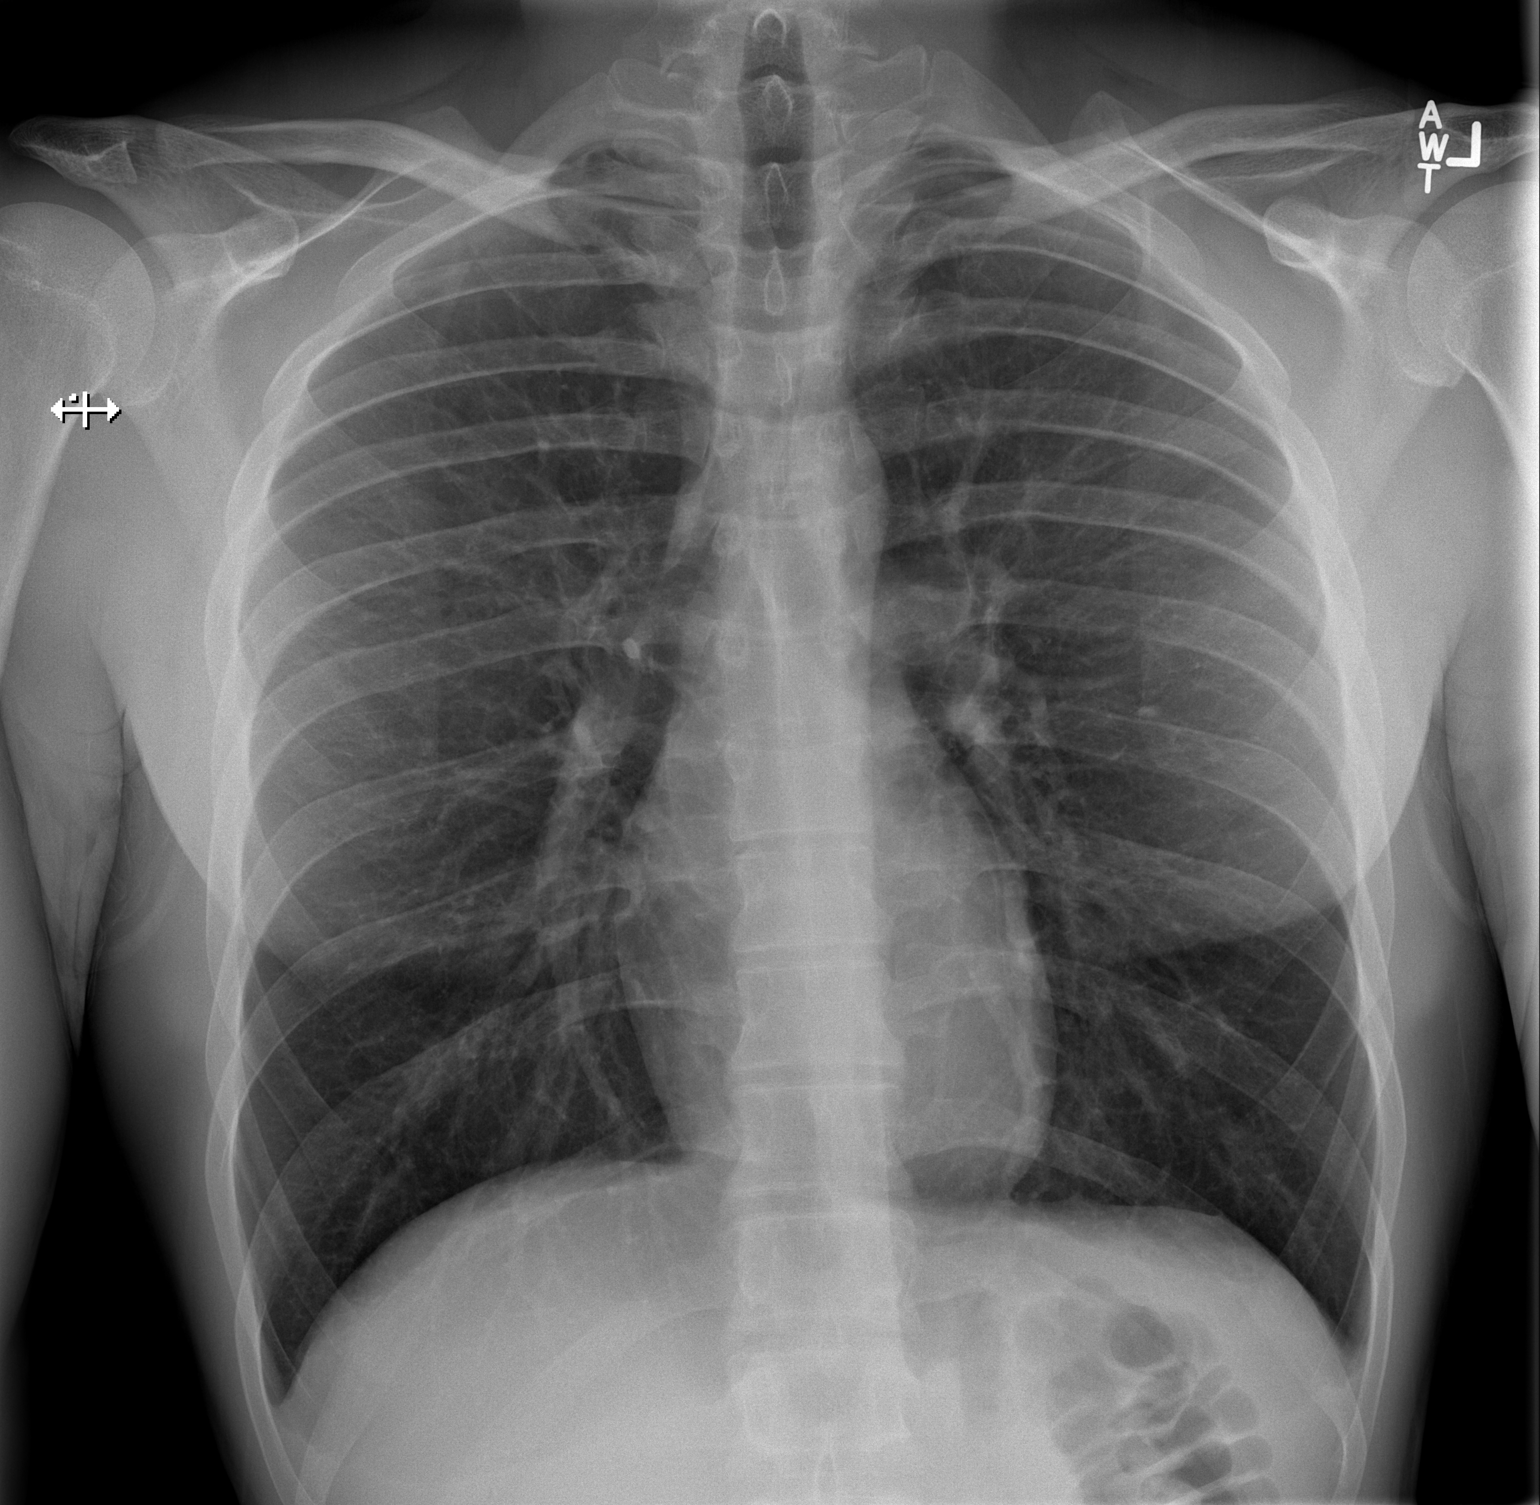

[w chest lat]
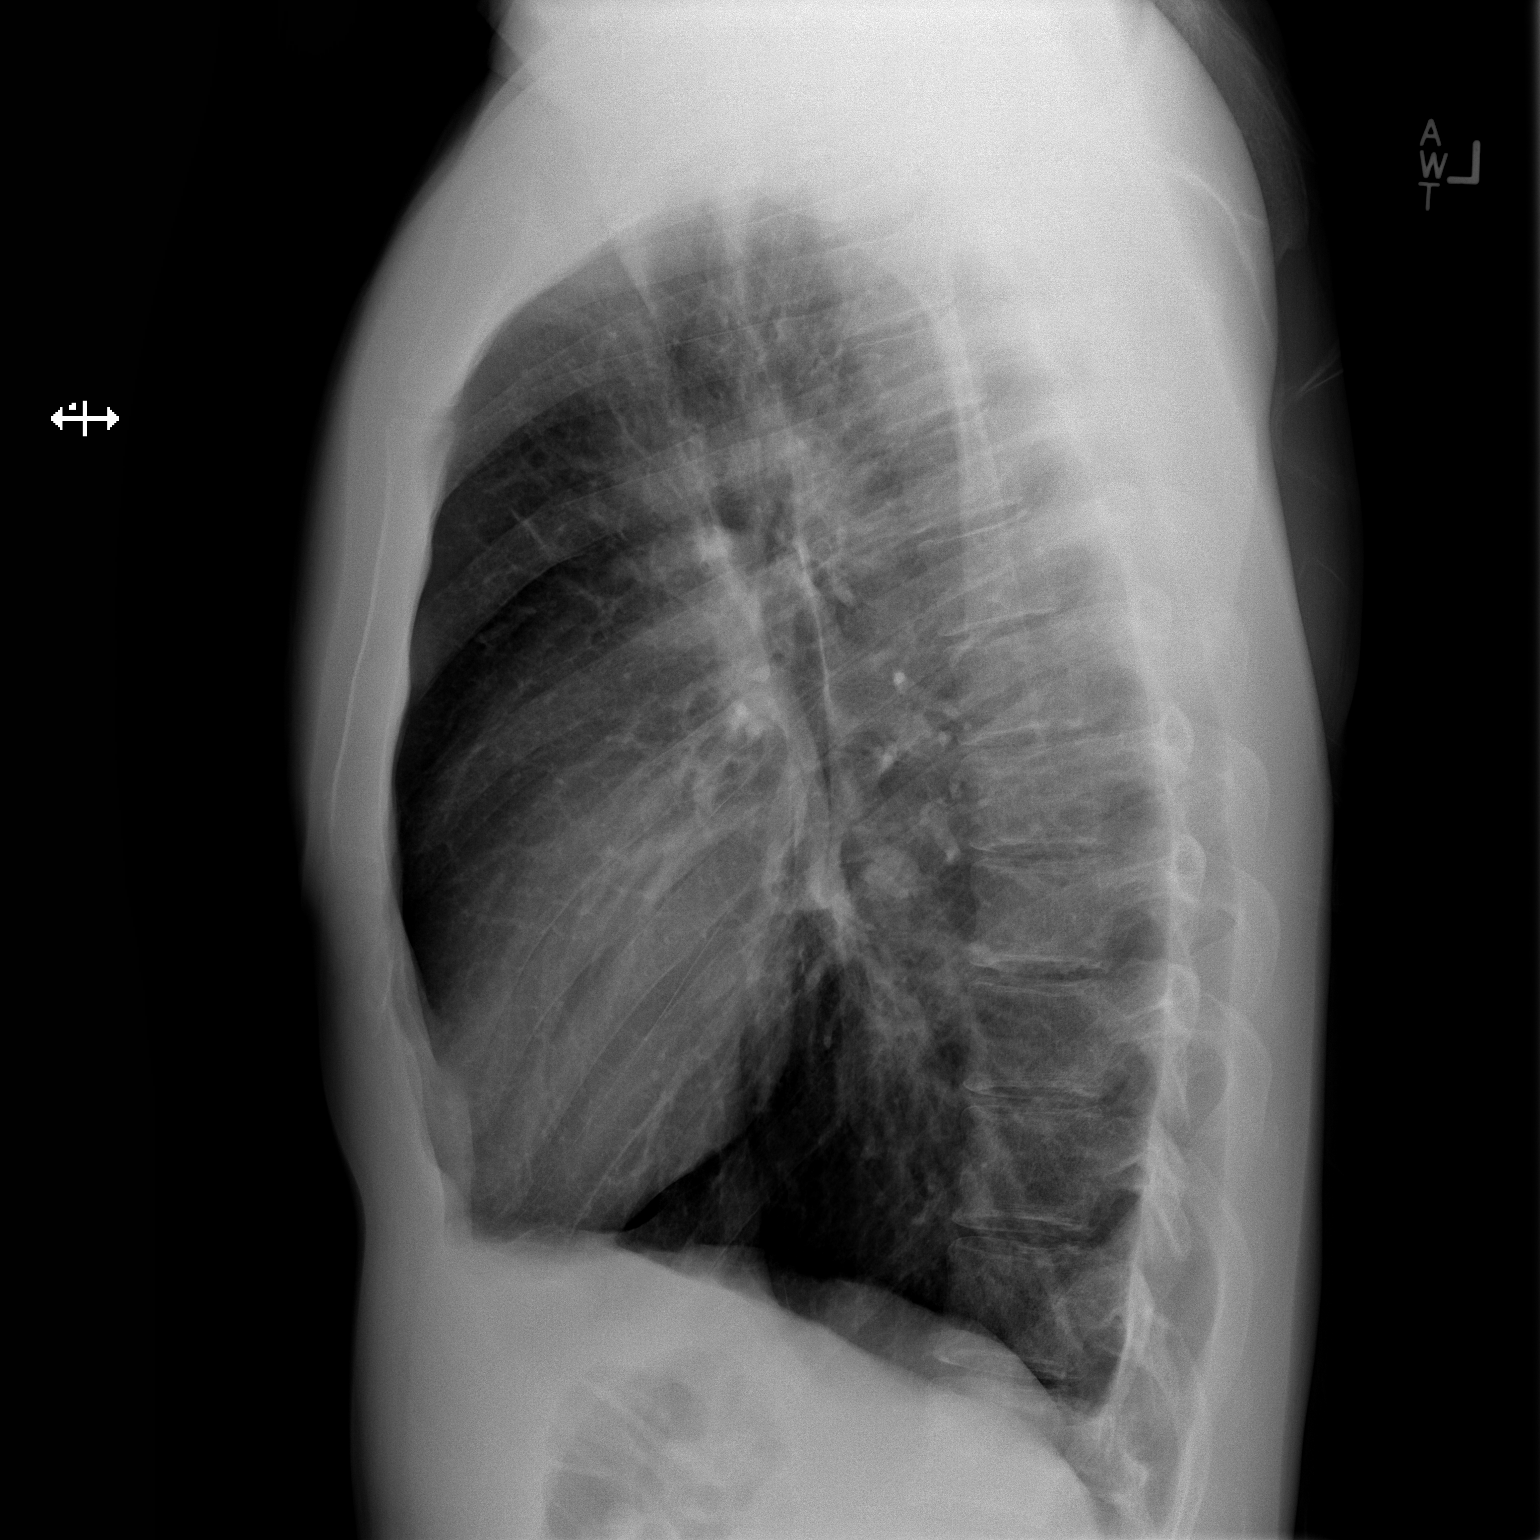

[2 of 2 positions shown; findings below may reference images not displayed]

FINDINGS: Lungs are clear. Heart size and mediastinal contours are within
normal limits.
No effusion.
Visualized skeletal structures are unremarkable.
IMPRESSION: No acute cardiopulmonary disease.

## 2016-10-15 ENCOUNTER — Encounter (HOSPITAL_COMMUNITY): Payer: Self-pay

## 2016-10-15 ENCOUNTER — Emergency Department (HOSPITAL_COMMUNITY)
Admission: EM | Admit: 2016-10-15 | Discharge: 2016-10-15 | Disposition: A | Discharge status: Home / Self Care | Payer: Self-pay | Attending: Emergency Medicine | Admitting: Emergency Medicine

## 2016-10-15 DIAGNOSIS — F17200 Nicotine dependence, unspecified, uncomplicated: Secondary | ICD-10-CM | POA: Insufficient documentation

## 2016-10-15 DIAGNOSIS — Z79899 Other long term (current) drug therapy: Secondary | ICD-10-CM | POA: Insufficient documentation

## 2016-10-15 DIAGNOSIS — K0889 Other specified disorders of teeth and supporting structures: Secondary | ICD-10-CM | POA: Insufficient documentation

## 2016-10-15 MED ORDER — HYDROCODONE-ACETAMINOPHEN 5-325 MG PO TABS
1.0000 | ORAL_TABLET | Freq: Once | ORAL | Status: AC
  Administered 2016-10-15: 1 via ORAL
  Filled 2016-10-15: qty 1

## 2016-10-15 MED ORDER — IBUPROFEN 400 MG PO TABS: 400.0000 mg | ORAL_TABLET | Freq: Four times a day (QID) | ORAL | 0 refills | Status: DC | PRN

## 2016-10-15 MED ORDER — ACETAMINOPHEN 500 MG PO TABS: 1000.0000 mg | ORAL_TABLET | Freq: Four times a day (QID) | ORAL | 0 refills | Status: DC | PRN

## 2016-10-15 NOTE — Discharge Instructions (Signed)
You have a decaying tooth but no evidence of abscess or infection. Please alternate taking ibuprofen and Tylenol as directed for pain, and follow up with a dentist for tooth extraction. Listed below are resources to find a dentist:  RESOURCE GUIDE  Chronic Pain Problems: Contact [facility]  [phone] Patients need to be referred by their primary care doctor.  Insufficient Money for Medicine: Contact [employer]:  call [phone]  No Primary Care Doctor: Call [facility]  [phone] - can help you locate a primary care doctor that  accepts your insurance, provides certain services, etc. Physician Referral Service- [phone]  Agencies that provide inexpensive medical care: [facility]  [facility]  [phone] [facility]  [phone] [facility]  [phone] [facility]  [phone] [facility]  [phone]  [place] Providers: [facility]- [address], [address]  [phone], Mon-Fri 9am-7pm, Sat 9am-1pm [place], [address] [place]  [place], [address] [place]  [place]- [address]  [place], [address], [phone]  Only accepts [place] patients after they have their name  applied to their card  Self Pay (no insurance) in [facility]: Sickle Cell Patients - [facility]  [place], [facility]- [facility]- [phone] [place] [address], [facility]- see information above (Speak to [employer] if you do not have insurance)       -  [facility]- [place],  [place], [place]   Dr Vista Lawman-  [address], [place], [place], [place]       -  [facility] and [place] [address] [phone]       -  [facility]- [address], [place], also [address], [phone]       -     [facility]- [address], [place], 1st & 3rd Saturday         every month, 10am-1pm  -     [facility]   [place] [address], [place].   Phone:  [phone], Fax:  [phone]. Hours of Operation:  9 am - 6 pm, M-F.  -     [facility]   [address], [place]   Phone: [phone], Fax: [phone]. Hours of Operation:  8:30 am - 5:30 pm, M-F.    Dental Assistance If unable to pay or uninsured, contact:  [facility]. to become qualified for the adult dental clinic.  Patients with Medicaid: [facility] ([phone] [address], [place] [address], [phone]  If unable to pay, or uninsured, contact [facility] [phone] in [place], [place] in [facility]) to become qualified for the adult dental clinic  [facility] [address] [place], [place] [address] [phone] [email or web address]  Other Scientist, forensic: [facility]-  [address] [place], [place], [address], [place], [address], 2nd and 4th Thursday of the month at 6:30am.  10 clients each day by appointment, can sometimes see walk-in patients if someone does not show for an appointment. [facility]- [address] [place]ard Danker [place], [place], [address], [place], [place], [place], [address], [employer] Department- [phone] [place] [facility] [phone]

## 2016-10-15 NOTE — ED Triage Notes (Signed)
Dental pain since yesterday.  Tylenol not working.

## 2016-10-15 NOTE — ED Provider Notes (Signed)
[facility] DEPT Provider Note   CSN: [id number] Arrival date & time: 10/15/16  [id number]     History   Chief Complaint Chief Complaint  Patient presents with  . Dental Pain    HPI Joshua Small is a 30 y.o. male.  Patient is 30 yo M with no PMH, presenting with dental pain that has been worsening since last night. States he has a decaying tooth to left lower molar that broke last night. He has not tried anything for relief. He does not have access to a dentist. Denies any abscess or foul taste in mouth. No difficulty swallowing, drooling, trismus, change in speech, or neck pain. No other complaints.      History reviewed. No pertinent past medical history.  There are no active problems to display for this patient.   Past Surgical History:  Procedure Laterality Date  . HAND SURGERY         Home Medications    Prior to Admission medications   Medication Sig Start Date End Date Taking? Authorizing Provider  acetaminophen (TYLENOL) 500 MG tablet Take 1,000 mg by mouth every 6 (six) hours as needed (toothache).    Historical Provider, MD  ibuprofen (ADVIL,MOTRIN) 400 MG tablet Take 1 tablet (400 mg total) by mouth every 6 (six) hours as needed. 09/17/14   Marissa Sciacca, PA-C  oxyCODONE-acetaminophen (PERCOCET/ROXICET) 5-325 MG per tablet Take 1 tablet by mouth every 8 (eight) hours as needed for moderate pain or severe pain. 09/17/14   Marissa Sciacca, PA-C    Family History History reviewed. No pertinent family history.  Social History Social History  Substance Use Topics  . Smoking status: Current Every Day Smoker  . Smokeless tobacco: Never Used  . Alcohol use No     Allergies   Patient has no known allergies.   Review of Systems Review of Systems  Constitutional: Negative for chills and fever.  HENT: Positive for dental problem. Negative for drooling, facial swelling, sore throat, trouble swallowing and voice change.   Musculoskeletal: Negative  for neck pain and neck stiffness.  Hematological: Negative for adenopathy.     Physical Exam Updated Vital Signs BP 144/89 (BP Location: Left Arm)   Pulse (!) 54   Temp 98.1 F (36.7 C) (Oral)   Resp 16   SpO2 99%   Physical Exam  Constitutional: He appears well-developed and well-nourished. No distress.  HENT:  Head: Normocephalic and atraumatic.  Mouth/Throat: Oropharynx is clear and moist.  Decaying 1st molar noted to left lower jaw. No evidence of dental abscess or oral lesions.  No oropharyngeal exudate, erythema, or edema. No tonsillar abscess or exudate.  Eyes: Conjunctivae are normal.  Neck: Normal range of motion.  Cardiovascular: Normal rate.   Pulmonary/Chest: Effort normal. No respiratory distress.  Musculoskeletal: Normal range of motion.  Neurological: He is alert.  Skin: Skin is warm and dry.  Psychiatric: He has a normal mood and affect.  Nursing note and vitals reviewed.    ED Treatments / Results  Labs (all labs ordered are listed, but only abnormal results are displayed) Labs Reviewed - No data to display  EKG  EKG Interpretation None       Radiology No results found.  Procedures Procedures (including critical care time)  Medications Ordered in ED Medications  HYDROcodone-acetaminophen (NORCO/VICODIN) 5-325 MG per tablet 1 tablet (1 tablet Oral Given 10/15/16 0937)     Initial Impression / Assessment and Plan / ED Course  I have reviewed the triage vital signs  and the nursing notes.  Pertinent labs & imaging results that were available during my care of the patient were reviewed by me and considered in my medical decision making (see chart for details).  Clinical Course    Patient is 30 yo M presenting with dental pain. Decaying 1st molar noted to left lower jaw, but no evidence of abscess or oral lesion. Patient is afebrile, VSS, and no concern for Ludwig's or deep space infection. Given 1 tablet of Norco prior to d/c, as well as  prescription for Tylenol and ibuprofen, with instructions to alternate every 6 hours for pain control. Also provided with referral to [facility] to establish care with a PCP, and resources to connect with a local dental clinic for tooth extraction. Return precautions discussed for new or worsening symptoms.  Final Clinical Impressions(s) / ED Diagnoses   Final diagnoses:  Pain, dental    New Prescriptions New Prescriptions   No medications on file     Rosilyn Mings II, [place] 10/16/16 2254    Fatima Blank, MD 10/19/16 [phone]

## 2017-02-11 ENCOUNTER — Encounter (HOSPITAL_COMMUNITY): Payer: Self-pay | Admitting: Emergency Medicine

## 2017-02-11 DIAGNOSIS — F17200 Nicotine dependence, unspecified, uncomplicated: Secondary | ICD-10-CM | POA: Insufficient documentation

## 2017-02-11 DIAGNOSIS — R112 Nausea with vomiting, unspecified: Secondary | ICD-10-CM | POA: Insufficient documentation

## 2017-02-11 DIAGNOSIS — Z79899 Other long term (current) drug therapy: Secondary | ICD-10-CM | POA: Insufficient documentation

## 2017-02-11 LAB — URINALYSIS, ROUTINE W REFLEX MICROSCOPIC
Bilirubin Urine: NEGATIVE
GLUCOSE, UA: NEGATIVE mg/dL
Hgb urine dipstick: NEGATIVE
Ketones, ur: NEGATIVE mg/dL
LEUKOCYTES UA: NEGATIVE
NITRITE: NEGATIVE
PH: 5 (ref 5.0–8.0)
Protein, ur: NEGATIVE mg/dL
Specific Gravity, Urine: 1.027 (ref 1.005–1.030)

## 2017-02-11 LAB — COMPREHENSIVE METABOLIC PANEL
ALT: 28 U/L (ref 17–63)
AST: 21 U/L (ref 15–41)
Albumin: 4 g/dL (ref 3.5–5.0)
Alkaline Phosphatase: 67 U/L (ref 38–126)
Anion gap: 6 (ref 5–15)
BILIRUBIN TOTAL: 0.8 mg/dL (ref 0.3–1.2)
BUN: 15 mg/dL (ref 6–20)
CHLORIDE: 104 mmol/L (ref 101–111)
CO2: 28 mmol/L (ref 22–32)
CREATININE: 0.97 mg/dL (ref 0.61–1.24)
Calcium: 8.9 mg/dL (ref 8.9–10.3)
Glucose, Bld: 118 mg/dL — ABNORMAL HIGH (ref 65–99)
Potassium: 4.1 mmol/L (ref 3.5–5.1)
Sodium: 138 mmol/L (ref 135–145)
TOTAL PROTEIN: 7.2 g/dL (ref 6.5–8.1)

## 2017-02-11 LAB — CBC
HCT: 44.3 % (ref 39.0–52.0)
Hemoglobin: 14.9 g/dL (ref 13.0–17.0)
MCH: 27.2 pg (ref 26.0–34.0)
MCHC: 33.6 g/dL (ref 30.0–36.0)
MCV: 81 fL (ref 78.0–100.0)
PLATELETS: 192 10*3/uL (ref 150–400)
RBC: 5.47 MIL/uL (ref 4.22–5.81)
RDW: 14.4 % (ref 11.5–15.5)
WBC: 10.7 10*3/uL — AB (ref 4.0–10.5)

## 2017-02-11 LAB — LIPASE, BLOOD: LIPASE: 22 U/L (ref 11–51)

## 2017-02-11 MED ORDER — ONDANSETRON 4 MG PO TBDP
4.0000 mg | ORAL_TABLET | Freq: Once | ORAL | Status: AC | PRN
  Administered 2017-02-11: 4 mg via ORAL
  Filled 2017-02-11: qty 1

## 2017-02-11 NOTE — ED Triage Notes (Signed)
Patient c/o emesis, nausea, body aches, and generalized abdominal pain x1 day. Denies chest pain, SOB, and diarrhea. Ambulatory to triage.

## 2017-02-12 ENCOUNTER — Emergency Department (HOSPITAL_COMMUNITY)
Admission: EM | Admit: 2017-02-12 | Discharge: 2017-02-12 | Disposition: A | Discharge status: Home / Self Care | Payer: Self-pay | Attending: Emergency Medicine | Admitting: Emergency Medicine

## 2017-02-12 DIAGNOSIS — R6889 Other general symptoms and signs: Secondary | ICD-10-CM

## 2017-02-12 DIAGNOSIS — R112 Nausea with vomiting, unspecified: Secondary | ICD-10-CM

## 2017-02-12 MED ORDER — IBUPROFEN 200 MG PO TABS
600.0000 mg | ORAL_TABLET | Freq: Once | ORAL | Status: AC
  Administered 2017-02-12: 600 mg via ORAL
  Filled 2017-02-12: qty 3

## 2017-02-12 MED ORDER — ONDANSETRON 8 MG PO TBDP: 8.0000 mg | ORAL_TABLET | Freq: Three times a day (TID) | ORAL | 0 refills | Status: DC | PRN

## 2017-02-12 MED ORDER — ONDANSETRON 8 MG PO TBDP
8.0000 mg | ORAL_TABLET | Freq: Once | ORAL | Status: AC
  Administered 2017-02-12: 8 mg via ORAL
  Filled 2017-02-12: qty 1

## 2017-02-12 MED ORDER — ACETAMINOPHEN ER 650 MG PO TBCR: 650.0000 mg | EXTENDED_RELEASE_TABLET | Freq: Three times a day (TID) | ORAL | 0 refills | Status: DC | PRN

## 2017-02-12 MED ORDER — ACETAMINOPHEN 325 MG PO TABS
650.0000 mg | ORAL_TABLET | Freq: Once | ORAL | Status: AC
  Administered 2017-02-12: 650 mg via ORAL
  Filled 2017-02-12: qty 2

## 2017-02-12 NOTE — ED Provider Notes (Addendum)
[facility] DEPT Provider Note   CSN: [id number] Arrival date & time: 02/11/17  1950  By signing my name below, I, Ethelle Lyon Long, attest that this documentation has been prepared under the direction and in the presence of Varney Biles, MD. Electronically Signed: Reinaldo Meeker, Scribe. 02/12/2017. 7:46 AM.   History   Chief Complaint Chief Complaint  Patient presents with  . Emesis  . Generalized Body Aches   The history is provided by the patient, medical records and a significant other. No language interpreter was used.   HPI Comments:  Joshua Small is a 31 y.o. male with no pertinent PMHx, who presents to the Emergency Department complaining of persistent emesis beginning this morning. Pt reports waking up this morning with nausea followed by multiple episodes of emesis throughout the day. He has reduced PO intake d/t to the nausea. His significant other reports he has been able to drink some today but has trouble keeping fluids down. Pt notes he normally does not get sick and is otherwise healthy. He has associated symptoms of fever (TMax 100), generalized myalgias, HA, and generalized abdominal pain. He did not try anything at home for relief of his symptoms PTA. Pt denies CP, SOB, cough, rhinorrhea, eye discharge, neck pain, sore throat, diarrhea, rash, dysuria, hematuria, and any other complaints at this time. Pt is a current every day smoker.  History reviewed. No pertinent past medical history.  There are no active problems to display for this patient.   Past Surgical History:  Procedure Laterality Date  . HAND SURGERY      Home Medications    Prior to Admission medications   Medication Sig Start Date End Date Taking? Authorizing Provider  acetaminophen (TYLENOL 8 HOUR) 650 MG CR tablet Take 1 tablet (650 mg total) by mouth every 8 (eight) hours as needed for pain. 02/12/17   Varney Biles, MD  ibuprofen (ADVIL,MOTRIN) 400 MG tablet Take 1 tablet (400 mg  total) by mouth every 6 (six) hours as needed. 10/15/16   Daryl F de Villier II, PA  ondansetron (ZOFRAN ODT) 8 MG disintegrating tablet Take 1 tablet (8 mg total) by mouth every 8 (eight) hours as needed for nausea. 02/12/17   Varney Biles, MD  oxyCODONE-acetaminophen (PERCOCET/ROXICET) 5-325 MG per tablet Take 1 tablet by mouth every 8 (eight) hours as needed for moderate pain or severe pain. 09/17/14   Marissa Sciacca, PA-C    Family History No family history on file.  Social History Social History  Substance Use Topics  . Smoking status: Current Every Day Smoker  . Smokeless tobacco: Never Used  . Alcohol use No     Allergies   Patient has no known allergies.   Review of Systems Review of Systems  Constitutional: Positive for fever.  HENT: Negative for rhinorrhea and sore throat.   Eyes: Negative for discharge.  Respiratory: Negative for cough and shortness of breath.   Cardiovascular: Negative for chest pain.  Gastrointestinal: Positive for abdominal pain, nausea and vomiting. Negative for diarrhea.  Genitourinary: Negative for dysuria and hematuria.  Musculoskeletal: Negative for neck pain.  Skin: Negative for rash.  Neurological: Positive for headaches.   10 Systems reviewed and are negative for acute change except as noted in the HPI.   Physical Exam Updated Vital Signs BP 114/76 (BP Location: Left Arm)   Pulse 82   Temp 99.1 F (37.3 C) (Oral)   Resp 16   Ht 6' (1.829 m)   Wt 175 lb (79.4  kg)   SpO2 97%   BMI 23.73 kg/m   Physical Exam  Constitutional: He is oriented to person, place, and time. He appears well-developed and well-nourished.  HENT:  Head: Normocephalic.  Eyes: Conjunctivae are normal.  Neck: Normal range of motion. Neck supple.  No meningismus  Cardiovascular: Normal rate and regular rhythm.   Pulmonary/Chest: Effort normal and breath sounds normal. He has no wheezes. He has no rales.  Lungs clear.  Abdominal: Soft. He exhibits no  distension. There is tenderness.  Generalized abdominal tenderness without rebounding or guarding.   Musculoskeletal: Normal range of motion.  Neurological: He is alert and oriented to person, place, and time.  Skin: Skin is warm and dry.  Psychiatric: He has a normal mood and affect.  Nursing note and vitals reviewed.    ED Treatments / Results  DIAGNOSTIC STUDIES:  Oxygen Saturation is 99% on RA, normal by my interpretation.    COORDINATION OF CARE:  1:38 AM Discussed treatment plan with pt at bedside including lab work, anti-emetics, PO challenge, and pt agreed to plan.  Labs (all labs ordered are listed, but only abnormal results are displayed) Labs Reviewed  COMPREHENSIVE METABOLIC PANEL - Abnormal; Notable for the following:       Result Value   Glucose, Bld 118 (*)    All other components within normal limits  CBC - Abnormal; Notable for the following:    WBC 10.7 (*)    All other components within normal limits  LIPASE, BLOOD  URINALYSIS, ROUTINE W REFLEX MICROSCOPIC    EKG  EKG Interpretation None       Radiology No results found.  Procedures Procedures (including critical care time)  Medications Ordered in ED Medications  ondansetron (ZOFRAN-ODT) disintegrating tablet 4 mg (4 mg Oral Given 02/11/17 2002)  ondansetron (ZOFRAN-ODT) disintegrating tablet 8 mg (8 mg Oral Given 02/12/17 0227)  acetaminophen (TYLENOL) tablet 650 mg (650 mg Oral Given 02/12/17 0229)  ibuprofen (ADVIL,MOTRIN) tablet 600 mg (600 mg Oral Given 02/12/17 0226)     Initial Impression / Assessment and Plan / ED Course  I have reviewed the triage vital signs and the nursing notes.  Pertinent labs & imaging results that were available during my care of the patient were reviewed by me and considered in my medical decision making (see chart for details).  Clinical Course as of Feb 12 745  Tue Feb 12, 2017  0304 Pt reassessed. Pt's VSS and WNL. Pt's cap refill < 3 seconds. Pt has  been hydrated in the ER and now passed po challenge. We will discharge with antiemetic. Strict ER return precautions have been discussed and pt will return if he is unable to tolerate fluids and symptoms are getting worse.   [AN]  0304 Headache is better.  [AN]    Clinical Course User Index [AN] Varney Biles, MD    Pt comes in with cc of N/V ABD PAIN / malaise and headaches.  No red flags for meningitis. He does have a low grade temp. Pt likely has a viral infection leading to Gi symptoms and headaches. PO challenge initiated. LAbs reassuring, no signs of profound dehydration.   Final Clinical Impressions(s) / ED Diagnoses   Final diagnoses:  Non-intractable vomiting with nausea, unspecified vomiting type  Flu-like symptoms    New Prescriptions Discharge Medication List as of 02/12/2017  3:11 AM    START taking these medications   Details  acetaminophen (TYLENOL 8 HOUR) 650 MG CR tablet Take 1 tablet (  650 mg total) by mouth every 8 (eight) hours as needed for pain., Starting Tue 02/12/2017, Print    ondansetron (ZOFRAN ODT) 8 MG disintegrating tablet Take 1 tablet (8 mg total) by mouth every 8 (eight) hours as needed for nausea., Starting Tue 02/12/2017, Print       I personally performed the services described in this documentation, which was scribed in my presence. The recorded information has been reviewed and is accurate.     Varney Biles, MD 02/12/17 7159    Varney Biles, MD 02/12/17 [phone]

## 2017-02-12 NOTE — ED Notes (Signed)
Pt tolerated food and fluids without symptoms of nausea.

## 2017-02-12 NOTE — Discharge Instructions (Signed)
°  We saw you in the ER for headaches, nausea, emesis, body aches, malaise. We think what you have is a viral syndrome - the treatment for which is symptomatic relief only, and your body will fight the infection off in a few days. We are prescribing you some meds for pain and fevers and nausea. See your primary care doctor in 1 week if the symptoms dont improve.

## 2018-01-27 ENCOUNTER — Encounter: Payer: Self-pay | Admitting: Family Medicine

## 2018-01-27 ENCOUNTER — Ambulatory Visit: Payer: BLUE CROSS/BLUE SHIELD | Admitting: Family Medicine

## 2018-01-27 ENCOUNTER — Other Ambulatory Visit: Payer: Self-pay

## 2018-01-27 VITALS — BP 126/75 | HR 64 | Temp 98.8°F | Resp 16 | Ht 72.0 in | Wt 195.4 lb

## 2018-01-27 DIAGNOSIS — D171 Benign lipomatous neoplasm of skin and subcutaneous tissue of trunk: Secondary | ICD-10-CM

## 2018-01-27 DIAGNOSIS — M62838 Other muscle spasm: Secondary | ICD-10-CM

## 2018-01-27 DIAGNOSIS — M549 Dorsalgia, unspecified: Secondary | ICD-10-CM

## 2018-01-27 MED ORDER — MELOXICAM 7.5 MG PO TABS: 7.5000 mg | ORAL_TABLET | Freq: Every day | ORAL | 0 refills | Status: DC

## 2018-01-27 MED ORDER — CYCLOBENZAPRINE HCL 5 MG PO TABS: ORAL_TABLET | ORAL | 0 refills | Status: DC

## 2018-01-27 NOTE — Progress Notes (Signed)
Subjective:    Patient ID: Joshua Small, male    DOB: 01/23/1986, 32 y.o.   MRN: [id number]  HPI  Joshua Small is a 32 y.o. male Presents today for: Chief Complaint  Patient presents with  . Back Pain    upper back x 1 wk  . Knot    "knot has been there about 2 yrs, has gotten worse in the last 6 mos, the only time that hurts is if I squeeze it."   Here primarily for back pain. Started about a week ago. Noticed after lifting some pipes at work. Works in Systems analyst, Social research officer, government. Did lift heavy pipe last Monday, but did not feel pop or known injury at that time. No fall. Noticed pain the next morning. Pain in left upper back. Does not radiate into arms. No neck pain. No numbness or weakness into hands. No prior back or neck issues.   No bowel or bladder incontinence, no saddle anesthesia, no lower extremity weakness.  No radiation of pain to legs.   Tx: tylenol, resting in bed, heating pad. Has avoided lifting since last week. Called out of work today as pain feels worse today.   Also bump on upper back for years, but feels like it is bigger in past 6 months. No discharge or drainage. Not sore unless squeezing it.    There are no active problems to display for this patient.  No past medical history on file. Past Surgical History:  Procedure Laterality Date  . HAND SURGERY     No Known Allergies Prior to Admission medications   Not on File   Social History   Socioeconomic History  . Marital status: Single    Spouse name: Not on file  . Number of children: Not on file  . Years of education: Not on file  . Highest education level: Not on file  Social Needs  . Financial resource strain: Not on file  . Food insecurity - worry: Not on file  . Food insecurity - inability: Not on file  . Transportation needs - medical: Not on file  . Transportation needs - non-medical: Not on file  Occupational History  . Not on file  Tobacco Use  . Smoking status: Current  Every Day Smoker  . Smokeless tobacco: Never Used  Substance and Sexual Activity  . Alcohol use: No  . Drug use: No  . Sexual activity: Not on file  Other Topics Concern  . Not on file  Social History Narrative  . Not on file    Review of Systems  Gastrointestinal: Negative for abdominal pain.  Genitourinary: Negative for difficulty urinating.  Musculoskeletal: Positive for arthralgias, back pain and myalgias.  Skin: Negative for color change and rash.  Neurological: Negative for weakness.       No le weakness. No bowel/bladder incontinence, no saddle anesthesia.   As in HPI     Objective:   Physical Exam  Constitutional: He is oriented to person, place, and time. He appears well-developed and well-nourished. No distress.  HENT:  Head: Normocephalic and atraumatic.  Pulmonary/Chest: Effort normal.  Musculoskeletal:       Cervical back: He exhibits tenderness (Minimal discomfort and upper back with terminal rotation/flexion of neck. No midline bony tenderness). He exhibits normal range of motion, no bony tenderness and no swelling.       Thoracic back: He exhibits tenderness and spasm. He exhibits normal range of motion and no bony tenderness.  Back:  Neurological: He is alert and oriented to person, place, and time.  Reflex Scores:      Tricep reflexes are 2+ on the right side and 2+ on the left side.      Bicep reflexes are 2+ on the right side and 2+ on the left side.      Brachioradialis reflexes are 2+ on the right side and 2+ on the left side. Skin: No rash noted.  Approximately 1 cm soft soft tissue mass at mid aspect of upper back at base of neck. Minimal tenderness with deep palpation,, no fluctuance, no surrounding erythema  Vitals reviewed.  Vitals:   01/27/18 1723  BP: 126/75  Pulse: 64  Resp: 16  Temp: 98.8 F (37.1 C)  TempSrc: Oral  SpO2: 96%  Weight: 195 lb 6.4 oz (88.6 kg)  Height: 6' (1.829 m)       Assessment & Plan:    Joshua Small is a 32 y.o. male Upper back pain - Plan: meloxicam (MOBIC) 7.5 MG tablet, cyclobenzaprine (FLEXERIL) 5 MG tablet Muscle spasm - Plan: cyclobenzaprine (FLEXERIL) 5 MG tablet  - Suspected upper back strain/trapezius spasm. May have slight impingement from cervical spine, but overall exam is reassuring without bony tenderness. Decided to defer x-rays at present time. Symptom Medicare discussed with Mobic 7.5 mg daily, Flexeril up to every 8 hours, side effects discussed. Avoid driving, operating heavy equipment till symptoms have improved and off sedating medications.  -RTC precautions given   Lipoma of back - Plan: Ambulatory referral to General Surgery  -Reported long-standing symptoms of soft tissue swelling likely lipoma. Does report some changes past 6 months, no concerning findings on exam at present but will have evaluated by general surgery for possible removal versus ultrasound  Meds ordered this encounter  Medications  . meloxicam (MOBIC) 7.5 MG tablet    Sig: Take 1 tablet (7.5 mg total) by mouth daily.    Dispense:  30 tablet    Refill:  0  . cyclobenzaprine (FLEXERIL) 5 MG tablet    Sig: 1 pill by mouth up to every 8 hours as needed. Start with one pill by mouth each bedtime as needed due to sedation    Dispense:  15 tablet    Refill:  0   Patient Instructions    You likely have a strained muscle in the upper back, which can lead to some muscle spasm as well. Try the mobic each morning (do not combine with other over the counter pain relievers), flexeril at night if needed.  Heat or ice to area as needed and range of motion/stretches as tolerated. Avoid heavy lifting for the next 1 week. Do not drive or operate machinery if you are  taking Flexeril.  Recheck in the next 1 week if not improving, sooner if worse  I will refer you to general surgery to evaluate the possible lipoma on the back for removal.  Let me know if there are questions. Thank you for coming in  today.   Muscle Cramps and Spasms Muscle cramps and spasms are when muscles tighten by themselves. They usually get better within minutes. Muscle cramps are painful. They are usually stronger and last longer than muscle spasms. Muscle spasms may or may not be painful. They can last a few seconds or much longer. Follow these instructions at home:  Drink enough fluid to keep your pee (urine) clear or pale yellow.  Massage, stretch, and relax the muscle.  If directed, apply heat  to tight or tense muscles as often as told by your doctor. Use the heat source that your doctor recommends. ? Place a towel between your skin and the heat source. ? Leave the heat on for 20-30 minutes. ? Take off the heat if your skin turns bright red. This is especially important if you are unable to feel pain, heat, or cold. You may have a greater risk of getting burned.  If directed, put ice on the affected area. This may help if you are sore or have pain after a cramp or spasm. ? Put ice in a plastic bag. ? Place a towel between your skin and the bag. ? Leave the ice on for 20 minutes, 2-3 times a day.  Take over-the-counter and prescription medicines only as told by your doctor.  Pay attention to any changes in your symptoms. Contact a doctor if:  Your cramps or spasms get worse or happen more often.  Your cramps or spasms do not get better with time. This information is not intended to replace advice given to you by your health care provider. Make sure you discuss any questions you have with your health care provider. Document Released: 10/25/2008 Document Revised: 12/14/2015 Document Reviewed: 08/16/2015 Elsevier Interactive Patient Education  2018 [employer].    IF you received an x-ray today, you will receive an invoice from [facility]. Please contact [facility] at [phone] with questions or concerns regarding your invoice.   IF you received labwork today, you will receive  an invoice from [place]. Please contact [facility] at [phone] with questions or concerns regarding your invoice.   Our billing staff will not be able to assist you with questions regarding bills from these companies.  You will be contacted with the lab results as soon as they are available. The fastest way to get your results is to activate your My Chart account. Instructions are located on the last page of this paperwork. If you have not heard from [place] regarding the results in 2 weeks, please contact this office.     Signed,   Merri Ray, MD [facility] [place].  01/29/18 9:13 PM

## 2018-01-27 NOTE — Patient Instructions (Addendum)
  You likely have a strained muscle in the upper back, which can lead to some muscle spasm as well. Try the mobic each morning (do not combine with other over the counter pain relievers), flexeril at night if needed.  Heat or ice to area as needed and range of motion/stretches as tolerated. Avoid heavy lifting for the next 1 week. Do not drive or operate machinery if you are  taking Flexeril.  Recheck in the next 1 week if not improving, sooner if worse  I will refer you to general surgery to evaluate the possible lipoma on the back for removal.  Let me know if there are questions. Thank you for coming in today.   Muscle Cramps and Spasms Muscle cramps and spasms are when muscles tighten by themselves. They usually get better within minutes. Muscle cramps are painful. They are usually stronger and last longer than muscle spasms. Muscle spasms may or may not be painful. They can last a few seconds or much longer. Follow these instructions at home:  Drink enough fluid to keep your pee (urine) clear or pale yellow.  Massage, stretch, and relax the muscle.  If directed, apply heat to tight or tense muscles as often as told by your doctor. Use the heat source that your doctor recommends. ? Place a towel between your skin and the heat source. ? Leave the heat on for 20-30 minutes. ? Take off the heat if your skin turns bright red. This is especially important if you are unable to feel pain, heat, or cold. You may have a greater risk of getting burned.  If directed, put ice on the affected area. This may help if you are sore or have pain after a cramp or spasm. ? Put ice in a plastic bag. ? Place a towel between your skin and the bag. ? Leave the ice on for 20 minutes, 2-3 times a day.  Take over-the-counter and prescription medicines only as told by your doctor.  Pay attention to any changes in your symptoms. Contact a doctor if:  Your cramps or spasms get worse or happen more  often.  Your cramps or spasms do not get better with time. This information is not intended to replace advice given to you by your health care provider. Make sure you discuss any questions you have with your health care provider. Document Released: 10/25/2008 Document Revised: 12/14/2015 Document Reviewed: 08/16/2015 Elsevier Interactive Patient Education  2018 [employer].    IF you received an x-ray today, you will receive an invoice from [facility]. Please contact [facility] at [phone] with questions or concerns regarding your invoice.   IF you received labwork today, you will receive an invoice from [place]. Please contact [facility] at [phone] with questions or concerns regarding your invoice.   Our billing staff will not be able to assist you with questions regarding bills from these companies.  You will be contacted with the lab results as soon as they are available. The fastest way to get your results is to activate your My Chart account. Instructions are located on the last page of this paperwork. If you have not heard from [place] regarding the results in 2 weeks, please contact this office.

## 2018-01-29 ENCOUNTER — Encounter: Payer: Self-pay | Admitting: Family Medicine

## 2018-01-30 ENCOUNTER — Encounter: Payer: Self-pay | Admitting: Family Medicine

## 2018-01-30 ENCOUNTER — Other Ambulatory Visit: Payer: Self-pay

## 2018-01-30 ENCOUNTER — Ambulatory Visit (INDEPENDENT_AMBULATORY_CARE_PROVIDER_SITE_OTHER): Payer: BLUE CROSS/BLUE SHIELD

## 2018-01-30 ENCOUNTER — Ambulatory Visit (INDEPENDENT_AMBULATORY_CARE_PROVIDER_SITE_OTHER): Payer: BLUE CROSS/BLUE SHIELD | Admitting: Family Medicine

## 2018-01-30 VITALS — BP 128/70 | HR 71 | Temp 98.0°F | Resp 18 | Ht 72.0 in | Wt 197.2 lb

## 2018-01-30 DIAGNOSIS — M542 Cervicalgia: Secondary | ICD-10-CM

## 2018-01-30 DIAGNOSIS — M549 Dorsalgia, unspecified: Secondary | ICD-10-CM

## 2018-01-30 DIAGNOSIS — Z131 Encounter for screening for diabetes mellitus: Secondary | ICD-10-CM | POA: Diagnosis not present

## 2018-01-30 LAB — GLUCOSE, POCT (MANUAL RESULT ENTRY): POC GLUCOSE: 100 mg/dL — AB (ref 70–99)

## 2018-01-30 MED ORDER — PREDNISONE 20 MG PO TABS: ORAL_TABLET | ORAL | 0 refills | Status: DC

## 2018-01-30 MED ORDER — HYDROCODONE-ACETAMINOPHEN 5-325 MG PO TABS: 1.0000 | ORAL_TABLET | Freq: Four times a day (QID) | ORAL | 0 refills | Status: DC | PRN

## 2018-01-30 NOTE — Patient Instructions (Addendum)
  Stop meloxicam, start prednisone. Ice or heat to affected area, gentle range of motion of neck as tolerated. Flexeril 1-2 pills every 8 hours or hydrocodone short-term. Do not combine those 2 medications.  If not improving in the next 3-4 days, please return to discuss further imaging or evaluation with orthopedics.    IF you received an x-ray today, you will receive an invoice from [facility]. Please contact [facility] at [phone] with questions or concerns regarding your invoice.   IF you received labwork today, you will receive an invoice from [place]. Please contact [facility] at [phone] with questions or concerns regarding your invoice.   Our billing staff will not be able to assist you with questions regarding bills from these companies.  You will be contacted with the lab results as soon as they are available. The fastest way to get your results is to activate your My Chart account. Instructions are located on the last page of this paperwork. If you have not heard from [place] regarding the results in 2 weeks, please contact this office.

## 2018-01-30 NOTE — Progress Notes (Signed)
Subjective:  By signing my name below, I, Moises Blood, attest that this documentation has been prepared under the direction and in the presence of Merri Ray, MD. Electronically Signed: Moises Blood, [place]. 01/30/2018 , 6:02 PM .  Patient was seen in Room 14 .   Patient ID: Joshua Small, male    DOB: 08-Jul-1986, 32 y.o.   MRN: [id number] Chief Complaint  Patient presents with  . Back Pain    follow up still having pain    HPI Joshua Small is a 32 y.o. male Patient was seen 3 days ago for upper back pain, noted after lifting a heavy pipe about a week prior. Pain was primarily noticed the next morning, suspected strain with trapezius spasm. He was treated with mobic and flexeril, discussed heat or ice, ROM, and avoiding heavy lifting. Follow up in 1 week. He works in Systems analyst.   Patient states his back is worse, describes feeling more tense. He denies any weakness in arms or legs. He's still sleeping in the chair. He hasn't tried ice therapy yet. He's been applying heating pad over the area without relief. He feels more pinching with certain ROM. He's been taking flexeril about every 4-6 hours. He denies trouble breathing. He mentions having some chest pain but notes history of reflux. He denies chest pain right now. He denies family history of heart problems. He hasn't returned to work yet. He's taken tramadol in the past without relief. He was seen in the ER about 1-2 year ago with similar problems. He denies narcotic addiction.   Last lab work March 2018, glucose 118.   There are no active problems to display for this patient.  No past medical history on file. Past Surgical History:  Procedure Laterality Date  . HAND SURGERY     No Known Allergies Prior to Admission medications   Medication Sig Start Date End Date Taking? Authorizing Provider  cyclobenzaprine (FLEXERIL) 5 MG tablet 1 pill by mouth up to every 8 hours as needed. Start with one pill  by mouth each bedtime as needed due to sedation 01/27/18  Yes Wendie Agreste, MD  meloxicam (MOBIC) 7.5 MG tablet Take 1 tablet (7.5 mg total) by mouth daily. 01/27/18  Yes Wendie Agreste, MD   Social History   Socioeconomic History  . Marital status: Single    Spouse name: Not on file  . Number of children: Not on file  . Years of education: Not on file  . Highest education level: Not on file  Social Needs  . Financial resource strain: Not on file  . Food insecurity - worry: Not on file  . Food insecurity - inability: Not on file  . Transportation needs - medical: Not on file  . Transportation needs - non-medical: Not on file  Occupational History  . Not on file  Tobacco Use  . Smoking status: Current Every Day Smoker  . Smokeless tobacco: Never Used  Substance and Sexual Activity  . Alcohol use: No  . Drug use: No  . Sexual activity: Not on file  Other Topics Concern  . Not on file  Social History Narrative  . Not on file   Review of Systems  Constitutional: Negative for fatigue and unexpected weight change.  Eyes: Negative for visual disturbance.  Respiratory: Negative for cough, chest tightness and shortness of breath.   Cardiovascular: Negative for chest pain, palpitations and leg swelling.  Gastrointestinal: Negative for abdominal pain and blood in stool.  Musculoskeletal: Positive for back pain (upper) and neck stiffness.  Neurological: Negative for dizziness, weakness, light-headedness and headaches.       Objective:   Physical Exam  Constitutional: He is oriented to person, place, and time. He appears well-developed and well-nourished. No distress.  HENT:  Head: Normocephalic and atraumatic.  Eyes: EOM are normal. Pupils are equal, round, and reactive to light.  Neck: Neck supple.  Cardiovascular: Normal rate, regular rhythm and normal heart sounds. Exam reveals no gallop and no friction rub.  No murmur heard. Pulmonary/Chest: Effort normal and breath  sounds normal. No respiratory distress.  Clear breath sounds heard equal in upper lobes bilaterally  Musculoskeletal: Normal range of motion.  Rotator cuff: full strength, full ROM bilaterally Neck: pinching pain into the left trapezius rhomboid area with cervical extension, similar sensation with terminal flexion, pain with left rotation but intact, pain with left lateral flexion Scapular non tender, tender along rhomboid and trapezius  Neurological: He is alert and oriented to person, place, and time.  Reflex Scores:      Tricep reflexes are 1+ on the right side and 1+ on the left side.      Bicep reflexes are 2+ on the right side and 2+ on the left side.      Brachioradialis reflexes are 2+ on the right side and 2+ on the left side. Skin: Skin is warm and dry.  Psychiatric: He has a normal mood and affect. His behavior is normal.  Nursing note and vitals reviewed.   Vitals:   01/30/18 1700  BP: 128/70  Pulse: 71  Resp: 18  Temp: 98 F (36.7 C)  TempSrc: Oral  SpO2: 99%  Weight: 197 lb 3.2 oz (89.4 kg)  Height: 6' (1.829 m)   Dg Cervical Spine Complete  Result Date: 01/30/2018 CLINICAL DATA:  Left upper back and neck pain EXAM: CERVICAL SPINE - COMPLETE 4+ VIEW COMPARISON:  None. FINDINGS: Straightening of the cervical spine. Mild degenerative changes at C5-C6 and C6-C7. Prevertebral soft tissue thickness is normal. Dens and lateral masses are within normal limits. IMPRESSION: Straightening of the cervical spine with mild degenerative changes. No acute osseous abnormality. Electronically Signed   By: Donavan Foil M.D.   On: 01/30/2018 18:32   Results for orders placed or performed in visit on 01/30/18  POCT glucose (manual entry)  Result Value Ref Range   POC Glucose 100 (A) 70 - 99 mg/dl       Assessment & Plan:   Joshua Small is a 32 y.o. male Neck pain - Plan: DG Cervical Spine Complete, HYDROcodone-acetaminophen (NORCO/VICODIN) 5-325 MG tablet, predniSONE  (DELTASONE) 20 MG tablet  Upper back pain on left side - Plan: DG Cervical Spine Complete, HYDROcodone-acetaminophen (NORCO/VICODIN) 5-325 MG tablet  Screening for diabetes mellitus - Plan: POCT glucose (manual entry)  Persistent left rhomboid/trapezius pain/ spasm. Straightening of cervical spine, suspect some spasm with possible radicular pain into upper back. Unrelieved with meloxicam and Flexeril. Reflexes appear equal, no apparent weakness  -Trial of prednisone, taper, potential side effects, risks discussed. Stop meloxicam  -Okay to continue Flexeril as needed, or can try hydrocodone as needed for more severe pain. Do not combine due to additive side effects, and risk/side effects of hydrocodone discussed.  -RTC precautions if not improving the next 3-4 days, sooner if worse. If not improving would consider orthopedic evaluation versus advanced imaging with MRI.   -Note provided for work .  Meds ordered this encounter  Medications  . HYDROcodone-acetaminophen (NORCO/VICODIN)  5-325 MG tablet    Sig: Take 1 tablet by mouth every 6 (six) hours as needed for moderate pain.    Dispense:  20 tablet    Refill:  0  . predniSONE (DELTASONE) 20 MG tablet    Sig: 3 by mouth for 3 days, then 2 by mouth for 2 days, then 1 by mouth for 2 days, then 1/2 by mouth for 2 days.    Dispense:  16 tablet    Refill:  0   Patient Instructions    Stop meloxicam, start prednisone. Ice or heat to affected area, gentle range of motion of neck as tolerated. Flexeril 1-2 pills every 8 hours or hydrocodone short-term. Do not combine those 2 medications.  If not improving in the next 3-4 days, please return to discuss further imaging or evaluation with orthopedics.    IF you received an x-ray today, you will receive an invoice from [facility]. Please contact [facility] at [phone] with questions or concerns regarding your invoice.   IF you received labwork today, you will receive an  invoice from [place]. Please contact [facility] at [phone] with questions or concerns regarding your invoice.   Our billing staff will not be able to assist you with questions regarding bills from these companies.  You will be contacted with the lab results as soon as they are available. The fastest way to get your results is to activate your My Chart account. Instructions are located on the last page of this paperwork. If you have not heard from [place] regarding the results in 2 weeks, please contact this office.      I personally performed the services described in this documentation, which was scribed in my presence. The recorded information has been reviewed and considered for accuracy and completeness, addended by me as needed, and agree with information above.  Signed,   Merri Ray, MD [facility] [place].  01/31/18 4:44 PM

## 2018-01-31 ENCOUNTER — Encounter: Payer: Self-pay | Admitting: Family Medicine

## 2018-02-24 ENCOUNTER — Other Ambulatory Visit: Payer: Self-pay | Admitting: Family Medicine

## 2018-02-24 DIAGNOSIS — M549 Dorsalgia, unspecified: Secondary | ICD-10-CM

## 2019-12-15 ENCOUNTER — Other Ambulatory Visit: Payer: Self-pay

## 2022-01-02 ENCOUNTER — Ambulatory Visit: Payer: Self-pay | Admitting: *Deleted

## 2022-01-02 NOTE — Telephone Encounter (Signed)
°  Chief Complaint: Patient's wife calling: weight loss, weakness Symptoms: weight loss, weakness, diarrhea Frequency: over 3-4 months Pertinent Negatives: Patient denies chest pain, fever, cough, SOB, vomiting, bleeding, other areas of pain Disposition: [] ED /[x] Urgent Care (no appt availability in office) / [] Appointment(In office/virtual)/ []  [place] [facility]/ [] Home Care/ [] Refused Recommended Disposition /[] [place] [facility]/ []  Follow-up with PCP Additional Notes: No PCP- advised UC for evaluation

## 2022-01-02 NOTE — Telephone Encounter (Signed)
Summary: fatigue and weight lost///   Pt wife called saying her husband needs to get into an office to be seen asap and get lab work.  She is concerned because he has lost 20 lbs in the last 3 to 4 months and has has fatigue.  She said he did have Covid about 2 weeks ago and was [facility].  I offered to make her an appt at [facility] because he does not have any ins but she wants him to be seen in the next week.   CB#  [phone] or [phone]      Reason for Disposition  [1] MODERATE weakness (i.e., interferes with work, school, normal activities) AND [2] cause unknown  (Exceptions: weakness with acute minor illness, or weakness from poor fluid intake)  Answer Assessment - Initial Assessment Questions 1. DESCRIPTION: "Describe how you are feeling."     Weight loss- lethargic  2. SEVERITY: "How bad is it?"  "Can you stand and walk?"   - MILD - Feels weak or tired, but does not interfere with work, school or normal activities   - [place] to stand and walk; weakness interferes with work, school, or normal activities   - SEVERE - Unable to stand or walk     mild 3. ONSET:  "When did the weakness begin?"     With weight loss over the last 2-3 months 4. CAUSE: "What do you think is causing the weakness?"     unsure 5. MEDICINES: "Have you recently started a new medicine or had a change in the amount of a medicine?"     no 6. OTHER SYMPTOMS: "Do you have any other symptoms?" (e.g., chest pain, fever, cough, SOB, vomiting, diarrhea, bleeding, other areas of pain)     Diarrhea, poor diet, dizziness, palpatation 7. PREGNANCY: "Is there any chance you are pregnant?" "When was your last menstrual period?"     *No Answer*  Protocols used: Weakness (Generalized) and Fatigue-A-AH

## 2022-02-15 ENCOUNTER — Other Ambulatory Visit: Payer: Self-pay

## 2022-02-15 ENCOUNTER — Emergency Department (HOSPITAL_COMMUNITY): Payer: Worker's Compensation

## 2022-02-15 ENCOUNTER — Encounter (HOSPITAL_COMMUNITY): Payer: Self-pay | Admitting: Pharmacy Technician

## 2022-02-15 ENCOUNTER — Emergency Department (HOSPITAL_COMMUNITY)
Admission: EM | Admit: 2022-02-15 | Discharge: 2022-02-15 | Disposition: A | Discharge status: Home / Self Care | Payer: Worker's Compensation | Attending: Emergency Medicine | Admitting: Emergency Medicine

## 2022-02-15 DIAGNOSIS — Y990 Civilian activity done for income or pay: Secondary | ICD-10-CM | POA: Insufficient documentation

## 2022-02-15 DIAGNOSIS — X58XXXA Exposure to other specified factors, initial encounter: Secondary | ICD-10-CM | POA: Diagnosis not present

## 2022-02-15 DIAGNOSIS — S62514A Nondisplaced fracture of proximal phalanx of right thumb, initial encounter for closed fracture: Secondary | ICD-10-CM | POA: Diagnosis not present

## 2022-02-15 DIAGNOSIS — S60011A Contusion of right thumb without damage to nail, initial encounter: Secondary | ICD-10-CM | POA: Diagnosis not present

## 2022-02-15 DIAGNOSIS — Z23 Encounter for immunization: Secondary | ICD-10-CM | POA: Diagnosis not present

## 2022-02-15 DIAGNOSIS — S6991XA Unspecified injury of right wrist, hand and finger(s), initial encounter: Secondary | ICD-10-CM | POA: Diagnosis present

## 2022-02-15 DIAGNOSIS — S60312A Abrasion of left thumb, initial encounter: Secondary | ICD-10-CM | POA: Diagnosis not present

## 2022-02-15 MED ORDER — TETANUS-DIPHTH-ACELL PERTUSSIS 5-2.5-18.5 LF-MCG/0.5 IM SUSY
0.5000 mL | PREFILLED_SYRINGE | Freq: Once | INTRAMUSCULAR | Status: AC
  Administered 2022-02-15: 0.5 mL via INTRAMUSCULAR
  Filled 2022-02-15: qty 0.5

## 2022-02-15 MED ORDER — TRAMADOL HCL 50 MG PO TABS: 50.0000 mg | ORAL_TABLET | Freq: Four times a day (QID) | ORAL | 0 refills | Status: DC | PRN

## 2022-02-15 MED ORDER — OXYCODONE-ACETAMINOPHEN 5-325 MG PO TABS
1.0000 | ORAL_TABLET | Freq: Once | ORAL | Status: AC
  Administered 2022-02-15: 1 via ORAL
  Filled 2022-02-15: qty 1

## 2022-02-15 MED ORDER — BACITRACIN ZINC 500 UNIT/GM EX OINT
TOPICAL_OINTMENT | Freq: Once | CUTANEOUS | Status: AC
  Administered 2022-02-15: 1 via TOPICAL

## 2022-02-15 NOTE — ED Provider Notes (Signed)
?[name] ?Provider Note ? ? ?CSN: [id number] ?Arrival date & time: 02/15/22  1248 ? ?  ? ?History ? ?Chief Complaint  ?Patient presents with  ? Hand Pain  ? ? ?Joshua Small is a 36 y.o. male. ? ?Patient presents s/p contusion/injury to right thumb at work just pta today. Was seen at urgent care and sent to ED. Patient with abrasion to dorsum thumb. Indicates tetanus was just updated. Right hand dominant. Is able to move thumb, albeit w pain. No numbness. No other pain or injury.  ? ?The history is provided by the patient and medical records.  ?Hand Pain ? ? ?  ? ?Home Medications ?Prior to Admission medications   ?Medication Sig Start Date End Date Taking? Authorizing Provider  ?traMADol (ULTRAM) 50 MG tablet Take 1 tablet (50 mg total) by mouth every 6 (six) hours as needed. 02/15/22  Yes Lajean Saver, MD  ?cyclobenzaprine (FLEXERIL) 5 MG tablet 1 pill by mouth up to every 8 hours as needed. Start with one pill by mouth each bedtime as needed due to sedation 01/27/18   Wendie Agreste, MD  ?HYDROcodone-acetaminophen (NORCO/VICODIN) 5-325 MG tablet Take 1 tablet by mouth every 6 (six) hours as needed for moderate pain. 01/30/18   Wendie Agreste, MD  ?meloxicam (MOBIC) 7.5 MG tablet TAKE 1 TABLET BY MOUTH EVERY DAY 02/25/18   Wendie Agreste, MD  ?predniSONE (DELTASONE) 20 MG tablet 3 by mouth for 3 days, then 2 by mouth for 2 days, then 1 by mouth for 2 days, then 1/2 by mouth for 2 days. 01/30/18   Wendie Agreste, MD  ?   ? ?Allergies    ?Patient has no known allergies.   ? ?Review of Systems   ?Review of Systems  ?Constitutional:  Negative for fever.  ?Gastrointestinal:  Negative for nausea and vomiting.  ?Musculoskeletal:   ?     Thumb contusion  ?Skin:   ?     Abrasion thumb  ?Neurological:  Negative for numbness.  ? ?Physical Exam ?Updated Vital Signs ?BP 130/71 (BP Location: Right Arm)   Pulse 66   Temp 98.8 ?F (37.1 ?C) (Oral)   Resp 16   SpO2 94%  ?Physical  Exam ?Vitals and nursing note reviewed.  ?Constitutional:   ?   Appearance: Normal appearance. He is well-developed.  ?HENT:  ?   Head: Atraumatic.  ?   Nose: Nose normal.  ?   Mouth/Throat:  ?   Mouth: Mucous membranes are moist.  ?Eyes:  ?   General: No scleral icterus. ?   Conjunctiva/sclera: Conjunctivae normal.  ?Neck:  ?   Trachea: No tracheal deviation.  ?Cardiovascular:  ?   Rate and Rhythm: Normal rate.  ?   Pulses: Normal pulses.  ?Pulmonary:  ?   Effort: Pulmonary effort is normal. No accessory muscle usage or respiratory distress.  ?Genitourinary: ?   Comments: No cva tenderness. ?Musculoskeletal:     ?   General: No swelling.  ?   Cervical back: Neck supple.  ?   Comments: Superficial abrasion dorsum left thumb. No deep lac or open injury/fracture. Mild soft tissue swelling. Thumb not tensely swollen. Normal cap refill distally at tip. Nail intact. Tenderness to proximal phalanx right thumb, no other focal bony tenderness to hand or wrist. Radial pulse 2+.   ?Skin: ?   General: Skin is warm and dry.  ?   Findings: No rash.  ?Neurological:  ?   Mental  Status: He is alert.  ?   Comments: Alert, speech clear. Right hand/thumb neurovascularly intact.   ?Psychiatric:     ?   Mood and Affect: Mood normal.  ? ? ?ED Results / Procedures / Treatments   ?Labs ?(all labs ordered are listed, but only abnormal results are displayed) ?Labs Reviewed - No data to display ? ?EKG ?None ? ?Radiology ?DG Finger Thumb Right ? ?Result Date: 02/15/2022 ?CLINICAL DATA:  Pain and swelling EXAM: RIGHT THUMB 2+V COMPARISON:  None. FINDINGS: There is a comminuted but nondisplaced and not angulated fracture of the proximal phalanx of the thumb. No visible extension to either the proximal or distal articular surface. IMPRESSION: Mildly comminuted but nondisplaced and not angulated fracture of the proximal phalanx of the thumb without involvement of either articular surface. Electronically Signed   By: Nelson Chimes M.D.   On:  02/15/2022 13:37   ? ?Procedures ?Procedures  ? ? ?Medications Ordered in ED ?Medications  ?oxyCODONE-acetaminophen (PERCOCET/ROXICET) 5-325 MG per tablet 1 tablet (1 tablet Oral Given 02/15/22 1343)  ?Tdap (BOOSTRIX) injection 0.5 mL (0.5 mLs Intramuscular Given 02/15/22 1343)  ?bacitracin ointment (1 application. Topical Given 02/15/22 1450)  ? ? ?ED Course/ Medical Decision Making/ A&P ?  ?                        ?Medical Decision Making ?Problems Addressed: ?Closed nondisplaced fracture of proximal phalanx of right thumb, initial encounter: acute illness or injury ?Contusion of right thumb without damage to nail, initial encounter: acute illness or injury ? ?Amount and/or Complexity of Data Reviewed ?Independent Historian: friend ?   Details: additional info ?External Data Reviewed: notes. ?Radiology: ordered and independent interpretation performed. Decision-making details documented in ED Course. ? ?Risk ?OTC drugs. ?Prescription drug management. ? ? ?Xrays. Tetanus updated.  ? ?Icepack.  ? ?Abrasion cleaned/bacitracin.  ? ?Ortho consulted - Ambrose Finland evaluated in ED, and spoke with Dr Grandville Silos - thumb spica splint and f/u in office in one week.  ? ?Splinted.  ? ?Rx ultram.  ? ?Pain controlled. Normal cap refill. ? ?Pt appears stable for d/c.  ? ? ? ? ? ? ? ? ? ? ? ?Final Clinical Impression(s) / ED Diagnoses ?Final diagnoses:  ?Contusion of right thumb without damage to nail, initial encounter  ?Closed nondisplaced fracture of proximal phalanx of right thumb, initial encounter  ? ? ?Rx / DC Orders ?ED Discharge Orders   ? ?      Ordered  ?  traMADol (ULTRAM) 50 MG tablet  Every 6 hours PRN       ? 02/15/22 1518  ? ?  ?  ? ?  ? ? ?  ?Lajean Saver, MD ?02/15/22 1531 ? ?

## 2022-02-15 NOTE — ED Triage Notes (Signed)
Pt here with pain and swelling to R thumb. Seen at [facility] and was told he has several fx's and to come to the ED for hand consult.  ?

## 2022-02-15 NOTE — Consult Note (Signed)
Reason for Consult:Right thumb prox phalanx fx ?Referring Physician: Lajean Saver ?Time called: 1412 ?Time at bedside: 1426 ? ? ?Joshua Small is an 36 y.o. male.  ?HPI: Joshua Small was working when a dolly fell and crushed his thumb between it and the edge of the truck bed. He had immediate pain. He went to UC, was diagnosed with a thumb prox phalanx fx and directed to come to the ED. He is LHD. ? ?History reviewed. No pertinent past medical history. ? ?Past Surgical History:  ?Procedure Laterality Date  ? HAND SURGERY    ? ? ?No family history on file. ? ?Social History:  reports that he has been smoking. He has never used smokeless tobacco. He reports that he does not drink alcohol and does not use drugs. ? ?Allergies: No Known Allergies ? ?Medications: I have reviewed the patient's current medications. ? ?No results found for this or any previous visit (from the past 48 hour(s)). ? ?DG Finger Thumb Right ? ?Result Date: 02/15/2022 ?CLINICAL DATA:  Pain and swelling EXAM: RIGHT THUMB 2+V COMPARISON:  None. FINDINGS: There is a comminuted but nondisplaced and not angulated fracture of the proximal phalanx of the thumb. No visible extension to either the proximal or distal articular surface. IMPRESSION: Mildly comminuted but nondisplaced and not angulated fracture of the proximal phalanx of the thumb without involvement of either articular surface. Electronically Signed   By: Nelson Chimes M.D.   On: 02/15/2022 13:37   ? ?Review of Systems  ?HENT:  Negative for ear discharge, ear pain, hearing loss and tinnitus.   ?Eyes:  Negative for photophobia and pain.  ?Respiratory:  Negative for cough and shortness of breath.   ?Cardiovascular:  Negative for chest pain.  ?Gastrointestinal:  Negative for abdominal pain, nausea and vomiting.  ?Genitourinary:  Negative for dysuria, flank pain, frequency and urgency.  ?Musculoskeletal:  Positive for arthralgias (Right thumb). Negative for back pain, myalgias and neck pain.   ?Neurological:  Negative for dizziness and headaches.  ?Hematological:  Does not bruise/bleed easily.  ?Psychiatric/Behavioral:  The patient is not nervous/anxious.   ?Blood pressure 130/71, pulse 66, temperature 98.8 ?F (37.1 ?C), temperature source Oral, resp. rate 16, SpO2 94 %. ?Physical Exam ?Constitutional:   ?   General: He is not in acute distress. ?   Appearance: He is well-developed. He is not diaphoretic.  ?HENT:  ?   Head: Normocephalic and atraumatic.  ?Eyes:  ?   General: No scleral icterus.    ?   Right eye: No discharge.     ?   Left eye: No discharge.  ?   Conjunctiva/sclera: Conjunctivae normal.  ?Cardiovascular:  ?   Rate and Rhythm: Normal rate and regular rhythm.  ?Pulmonary:  ?   Effort: Pulmonary effort is normal. No respiratory distress.  ?Musculoskeletal:  ?   Cervical back: Normal range of motion.  ?   Comments: Right shoulder, elbow, wrist, digits- Superficial abrasion ulnar base of thumb, mod TTP prox phalax, no instability, no blocks to motion ? Sens  Ax/R/M/U intact ? Mot   Ax/ R/ PIN/ M/ AIN/ U intact ? Rad 2+  ?Skin: ?   General: Skin is warm and dry.  ?Neurological:  ?   Mental Status: He is alert.  ?Psychiatric:     ?   Mood and Affect: Mood normal.     ?   Behavior: Behavior normal.  ? ? ?Assessment/Plan: ?Left thumb prox phalanx fx -- Thumb spica splint and f/u with Dr.  Thompson next week. ? ? ? ?Lisette Abu, PA-C ?Orthopedic Surgery ?[phone] ?02/15/2022, 2:31 PM  ?

## 2022-02-15 NOTE — ED Provider Triage Note (Signed)
Emergency Medicine Provider Triage Evaluation Note ? ?Barton Fanny , a 36 y.o. male  was evaluated in triage.  Pt complains of  crush injury to right thumb that occurred yesterday at work.  He reports that he was working on a dolly with pallets and states that the dolly fell and landed on his right thumb.  He has a small wound to the thumb.  He went to urgent care today who did x-rays and stated that he had multiple fractures and needed to come to the ED for hand consultation.  He states he is not up-to-date on tetanus.  He is left-hand dominant. ? ?Review of Systems  ?Positive: + R thumb injury ?Negative: - weakness, numbness ? ?Physical Exam  ?BP 130/71 (BP Location: Right Arm)   Pulse 66   Temp 98.8 ?F (37.1 ?C) (Oral)   Resp 16   SpO2 94%  ?Gen:   Awake, no distress   ?Resp:  Normal effort  ?MSK:   Moves extremities without difficulty  ?Other:   ? ?Medical Decision Making  ?Medically screening exam initiated at 1:36 PM.  Appropriate orders placed.  Dayne Chait was informed that the remainder of the evaluation will be completed by another provider, this initial triage assessment does not replace that evaluation, and the importance of remaining in the ED until their evaluation is complete. ? ? ?  ?Eustaquio Maize, PA-C ?02/15/22 1337 ? ?

## 2022-02-15 NOTE — ED Notes (Signed)
Pt verbalized understanding of d/c instructions, meds, and followup care. Denies questions. VSS, no distress noted. Steady gait to exit with all belongings.  ?

## 2022-02-15 NOTE — ED Notes (Signed)
Wound rinsed with NS. Air dry. Bacitracin applied. Ortho called. ?

## 2022-02-15 NOTE — Discharge Instructions (Addendum)
It was our pleasure to provide your ER care today - we hope that you feel better. ? ?Elevate hand. Icepack/coldpack to area. Keep splint clean and dry. ? ?Take acetaminophen or ibuprofen as need for pain. You may also take ultram as need for pain - no driving when taking.  ? ?Follow up orthopedic hand specialist in one week - call office to arrange appointment time.  ? ?Return to ER if worse, new symptoms, severe/intractable pain, infection of wound/spreading redness, fevers, or other concern. ?

## 2022-02-15 NOTE — Progress Notes (Signed)
Orthopedic Tech Progress Note ?Patient Details:  ?Joshua Small ?[birth year] ?[id number] ? ?Ortho Devices ?Type of Ortho Device: Thumb spica splint ?Splint Material: Fiberglass ?Ortho Device/Splint Location: RUE ?Ortho Device/Splint Interventions: Application, Ordered ?  ?Post Interventions ?Patient Tolerated: Well ? ?Joshua Small ?02/15/2022, 2:54 PM ? ?

## 2023-05-29 IMAGING — CR DG FINGER THUMB 2+V*R*
3 series · 3 of 3 positions shown · non-contrast
Comparison: None.

CLINICAL DATA: Pain and swelling

EXAM:
RIGHT THUMB 2+V

[finger ap]
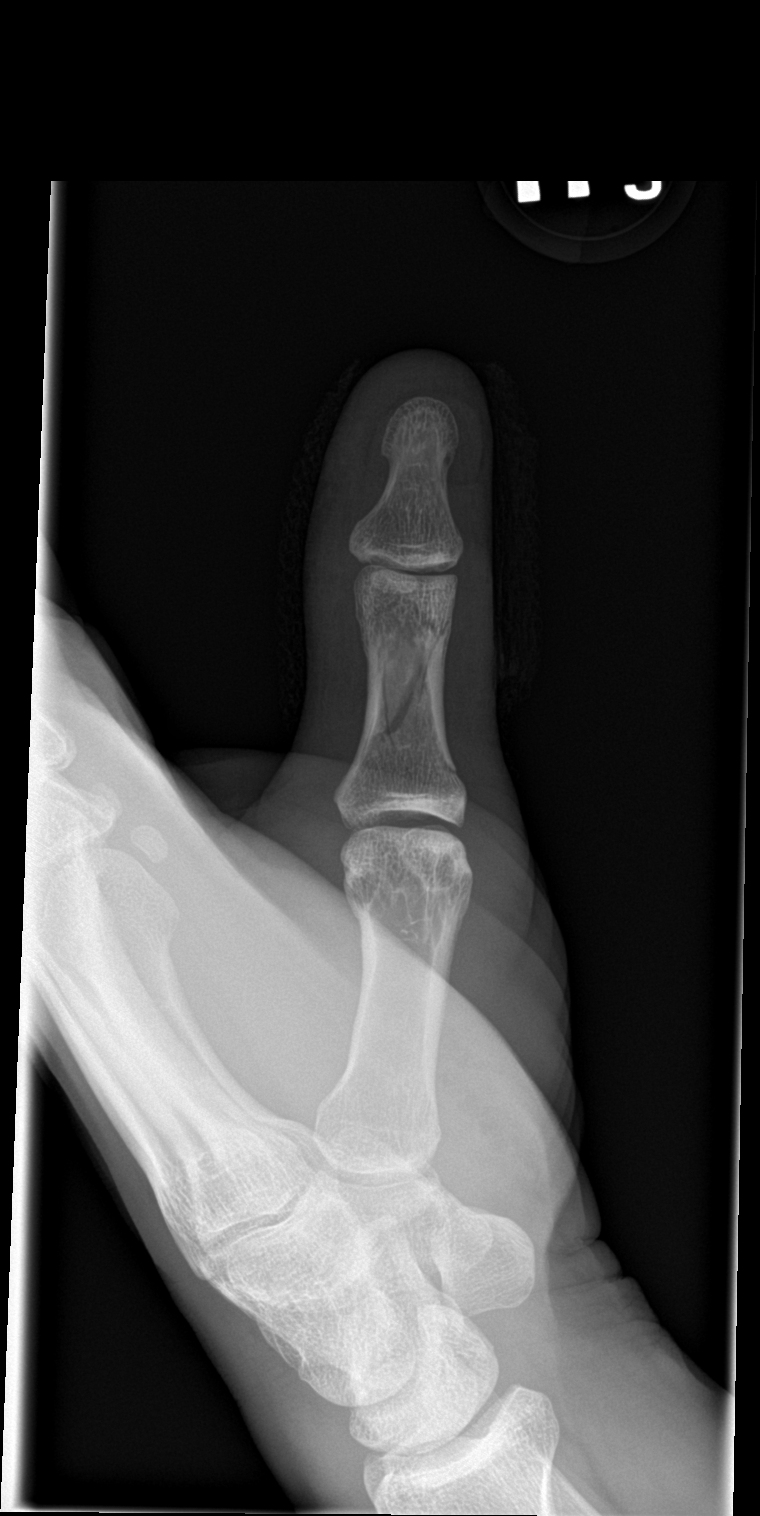

[finger obl]
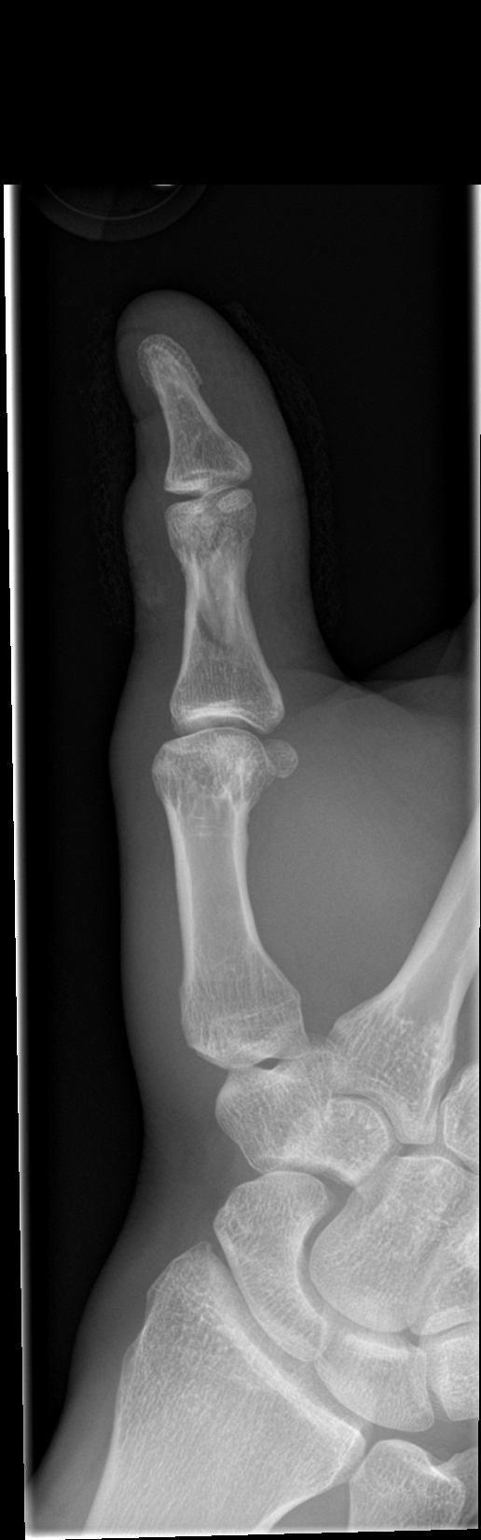

[finger lat]
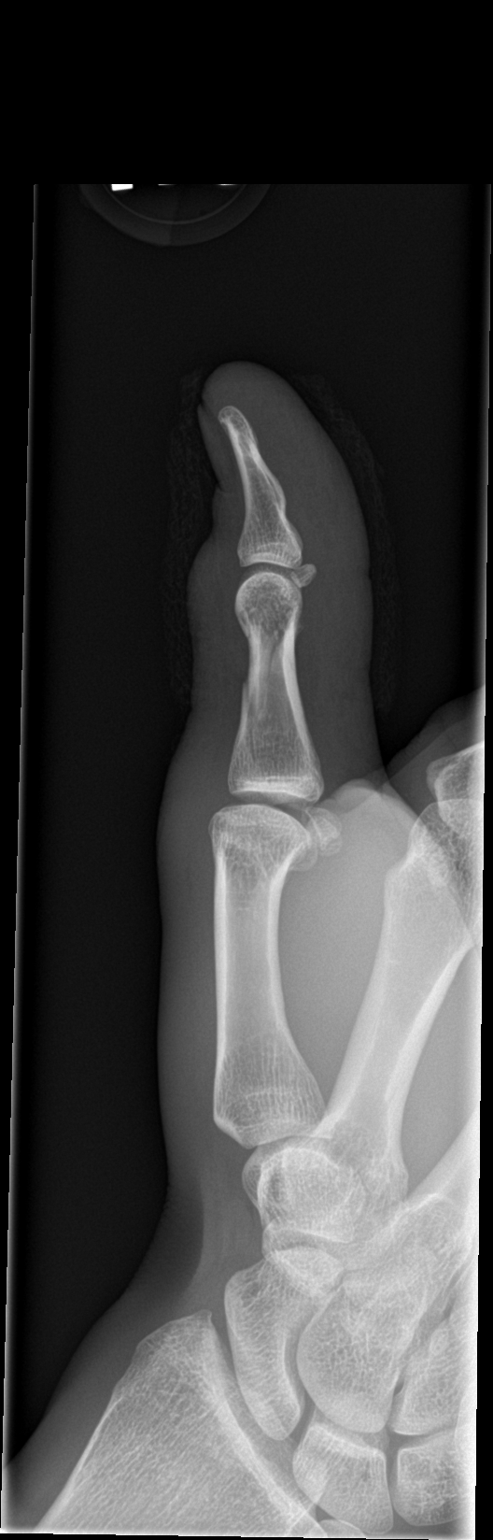

[3 of 3 positions shown; findings below may reference images not displayed]

FINDINGS: There is a comminuted but nondisplaced and not angulated fracture of
the proximal phalanx of the thumb. No visible extension to either
the proximal or distal articular surface.
IMPRESSION: Mildly comminuted but nondisplaced and not angulated fracture of the
proximal phalanx of the thumb without involvement of either
articular surface.

## 2024-02-19 ENCOUNTER — Ambulatory Visit (HOSPITAL_COMMUNITY)
Admission: EM | Admit: 2024-02-19 | Discharge: 2024-02-19 | Disposition: A | Discharge status: Home / Self Care | Attending: Emergency Medicine | Admitting: Emergency Medicine

## 2024-02-19 ENCOUNTER — Ambulatory Visit (INDEPENDENT_AMBULATORY_CARE_PROVIDER_SITE_OTHER)

## 2024-02-19 ENCOUNTER — Encounter (HOSPITAL_COMMUNITY): Payer: Self-pay

## 2024-02-19 DIAGNOSIS — M79641 Pain in right hand: Secondary | ICD-10-CM | POA: Diagnosis not present

## 2024-02-19 DIAGNOSIS — M25531 Pain in right wrist: Secondary | ICD-10-CM

## 2024-02-19 NOTE — ED Triage Notes (Signed)
 Right wrist pain and swelling x 1 week. Patient works with his hands.

## 2024-02-19 NOTE — ED Provider Notes (Signed)
 [facility]    CSN: [id number] Arrival date & time: 02/19/24  1138      History   Chief Complaint Chief Complaint  Patient presents with   Wrist Pain    HPI Joshua Small is a 38 y.o. male.   Patient presents with right wrist and hand pain with mild swelling x 1 week.  Patient states that he fell about a week ago unsure exactly when he landed on his wrist.  Patient states the pain has lessened over the last few days and he is able to move his wrist and hand with minimal pain.  Patient denies taking medication for symptoms.  Patient requesting a work note stating that he can return to work.   Wrist Pain    History reviewed. No pertinent past medical history.  There are no active problems to display for this patient.   Past Surgical History:  Procedure Laterality Date   HAND SURGERY         Home Medications    Prior to Admission medications   Not on File    Family History History reviewed. No pertinent family history.  Social History Social History   Tobacco Use   Smoking status: Every Day   Smokeless tobacco: Never  Substance Use Topics   Alcohol use: No   Drug use: No     Allergies   Patient has no known allergies.   Review of Systems Review of Systems  Per HPI  Physical Exam Triage Vital Signs ED Triage Vitals [02/19/24 1347]  Encounter Vitals Group     BP 119/76     Systolic BP Percentile      Diastolic BP Percentile      Pulse Rate (!) 56     Resp 18     Temp 98.5 F (36.9 C)     Temp Source Oral     SpO2 97 %     Weight      Height      Head Circumference      Peak Flow      Pain Score 4     Pain Loc      Pain Education      Exclude from Growth Chart    No data found.  Updated Vital Signs BP 119/76 (BP Location: Left Arm)   Pulse (!) 56   Temp 98.5 F (36.9 C) (Oral)   Resp 18   SpO2 97%   Visual Acuity Right Eye Distance:   Left Eye Distance:   Bilateral Distance:    Right Eye Near:    Left Eye Near:    Bilateral Near:     Physical Exam Vitals and nursing note reviewed.  Constitutional:      General: He is awake. He is not in acute distress.    Appearance: Normal appearance. He is well-developed and well-groomed. He is not ill-appearing.  Musculoskeletal:     Right wrist: Normal.     Right hand: Tenderness present.     Comments: Mild tenderness noted to the dorsal aspect of hand just above the scaphoid bone.  Neurological:     Mental Status: He is alert.  Psychiatric:        Behavior: Behavior is cooperative.      UC Treatments / Results  Labs (all labs ordered are listed, but only abnormal results are displayed) Labs Reviewed - No data to display  EKG   Radiology DG Hand Complete Right Result Date: 02/19/2024 CLINICAL DATA:  Right  wrist pain and swelling for 1 week. Right hand pain. EXAM: RIGHT HAND - COMPLETE 3+ VIEW COMPARISON:  Right thumb radiographs 02/15/2022, right hand radiographs 08/02/2006 FINDINGS: Compared to remote 08/02/2006 radiographs, there is dorsal plate and screw fixation of the proximal and mid shafts of the proximal phalanx of the second and third fingers, new from prior. No perihardware lucency is seen to indicate hardware failure or loosening. IMPRESSION: Dorsal plate and screw fixation of the proximal and mid shafts of the proximal phalanx of the second and third fingers, new from remote 08/02/2006 radiographs. No evidence of hardware failure or loosening. No acute fracture. Electronically Signed   By: Neita Garnet M.D.   On: 02/19/2024 15:39    Procedures Procedures (including critical care time)  Medications Ordered in UC Medications - No data to display  Initial Impression / Assessment and Plan / UC Course  I have reviewed the triage vital signs and the nursing notes.  Pertinent labs & imaging results that were available during my care of the patient were reviewed by me and considered in my medical decision making (see chart  for details).     Upon assessment there is mild tenderness noted to the dorsal aspect of patient's hand just above the scaphoid.  Based on my interpretation of x-ray there is no acute osseous abnormality.  Radiology report confirms this.  Recommend alternating between Tylenol and ibuprofen as needed for pain.  Given orthopedic follow-up if pain persist.  Given work note as requested.  Discussed return precautions. Final Clinical Impressions(s) / UC Diagnoses   Final diagnoses:  Hand pain, right  Right wrist pain     Discharge Instructions      Based on my interpretation of your x-ray there is no underlying fracture or injury noted to your hand or wrist.  If the radiology report reads differently I will call you later today and adjust the treatment plan as needed.  You can alternate between 650 mg of Tylenol (do not exceed 4000 mg in 1 day) and 400 to 600 mg ibuprofen (do not exceed 2400 mg in 1 day) every 4-6 hours as needed for pain.  Otherwise you can alternate between ice and heat as needed for pain.  I have attached [facility] that you can follow-up with if your pain persist.  Return here for symptoms persist or worsen.     ED Prescriptions   None    PDMP not reviewed this encounter.   Wynonia Lawman A, NP 02/19/24 [phone]

## 2024-02-19 NOTE — Discharge Instructions (Addendum)
 Based on my interpretation of your x-ray there is no underlying fracture or injury noted to your hand or wrist.  If the radiology report reads differently I will call you later today and adjust the treatment plan as needed.  You can alternate between 650 mg of Tylenol (do not exceed 4000 mg in 1 day) and 400 to 600 mg ibuprofen (do not exceed 2400 mg in 1 day) every 4-6 hours as needed for pain.  Otherwise you can alternate between ice and heat as needed for pain.  I have attached [facility] that you can follow-up with if your pain persist.  Return here for symptoms persist or worsen.
